# Patient Record
Sex: Female | Born: 1960
Health system: Southern US, Community
[De-identification: ages and names within clinical notes are randomized; demographics above are authoritative.]

## PROBLEM LIST (undated history)

## (undated) DIAGNOSIS — F0390 Unspecified dementia without behavioral disturbance: Secondary | ICD-10-CM

## (undated) DIAGNOSIS — I447 Left bundle-branch block, unspecified: Secondary | ICD-10-CM

## (undated) DIAGNOSIS — K219 Gastro-esophageal reflux disease without esophagitis: Secondary | ICD-10-CM

## (undated) DIAGNOSIS — G4700 Insomnia, unspecified: Secondary | ICD-10-CM

## (undated) DIAGNOSIS — I509 Heart failure, unspecified: Secondary | ICD-10-CM

## (undated) DIAGNOSIS — D509 Iron deficiency anemia, unspecified: Secondary | ICD-10-CM

## (undated) DIAGNOSIS — I10 Essential (primary) hypertension: Secondary | ICD-10-CM

## (undated) DIAGNOSIS — Z95810 Presence of automatic (implantable) cardiac defibrillator: Secondary | ICD-10-CM

## (undated) HISTORY — PX: OTHER SURGICAL HISTORY: SHX169

## (undated) HISTORY — PX: DILATION AND CURETTAGE OF UTERUS: SHX78

## (undated) HISTORY — PX: ESOPHAGOGASTRODUODENOSCOPY: SHX1529

## (undated) HISTORY — PX: CARDIAC CATHETERIZATION: SHX172

## (undated) HISTORY — PX: ANKLE SURGERY: SHX546

## (undated) HISTORY — PX: TUBAL LIGATION: SHX77

## (undated) HISTORY — PX: COLONOSCOPY: SHX174

---

## 2004-08-15 ENCOUNTER — Ambulatory Visit: Payer: Self-pay

## 2005-10-20 ENCOUNTER — Ambulatory Visit: Payer: Self-pay | Admitting: Internal Medicine

## 2006-03-25 ENCOUNTER — Emergency Department: Payer: Self-pay | Admitting: Emergency Medicine

## 2008-11-03 HISTORY — PX: OTHER SURGICAL HISTORY: SHX169

## 2011-03-26 ENCOUNTER — Ambulatory Visit: Payer: Self-pay | Admitting: Internal Medicine

## 2011-09-01 ENCOUNTER — Ambulatory Visit: Payer: Self-pay | Admitting: Internal Medicine

## 2011-10-16 ENCOUNTER — Ambulatory Visit: Payer: Self-pay | Admitting: Gastroenterology

## 2011-10-17 LAB — PATHOLOGY REPORT

## 2012-06-01 ENCOUNTER — Emergency Department: Payer: Self-pay | Admitting: Emergency Medicine

## 2012-06-01 LAB — TROPONIN I
Troponin-I: <0.02 ng/mL
Troponin-I: <0.02 ng/mL

## 2012-06-01 LAB — CBC WITH DIFFERENTIAL/PLATELET
Eosinophil %: 10.6 %
HCT: 32.6 % — ABNORMAL LOW (ref 35.0–47.0)
Lymphocyte #: 2.3 10*3/uL (ref 1.0–3.6)
Lymphocyte %: 30.2 %
MCH: 28.3 pg (ref 26.0–34.0)
MCHC: 33.5 g/dL (ref 32.0–36.0)
MCV: 84 fL (ref 80–100)
Neutrophil #: 4 10*3/uL (ref 1.4–6.5)
Platelet: 260 10*3/uL (ref 150–440)
RDW: 15.8 % — ABNORMAL HIGH (ref 11.5–14.5)
WBC: 7.7 10*3/uL (ref 3.6–11.0)

## 2012-06-01 LAB — COMPREHENSIVE METABOLIC PANEL
Albumin: 3.1 g/dL — ABNORMAL LOW (ref 3.4–5.0)
Anion Gap: 6 — ABNORMAL LOW (ref 7–16)
Calcium, Total: 8.2 mg/dL — ABNORMAL LOW (ref 8.5–10.1)
Chloride: 108 mmol/L — ABNORMAL HIGH (ref 98–107)
Co2: 25 mmol/L (ref 21–32)
EGFR (African American): >60
Glucose: 105 mg/dL — ABNORMAL HIGH (ref 65–99)
Osmolality: 279 (ref 275–301)
Potassium: 4.6 mmol/L (ref 3.5–5.1)
SGPT (ALT): 15 U/L
Sodium: 139 mmol/L (ref 136–145)
Total Protein: 6.6 g/dL (ref 6.4–8.2)

## 2012-06-01 LAB — URINALYSIS, COMPLETE
Bilirubin,UR: NEGATIVE
Blood: NEGATIVE
Glucose,UR: NEGATIVE mg/dL (ref 0–75)
Leukocyte Esterase: NEGATIVE
Nitrite: NEGATIVE
Ph: 5 (ref 4.5–8.0)
RBC,UR: <1 /HPF (ref 0–5)
Specific Gravity: 1.006 (ref 1.003–1.030)
Squamous Epithelial: <1

## 2012-06-04 ENCOUNTER — Ambulatory Visit: Payer: Self-pay | Admitting: Internal Medicine

## 2014-04-06 DIAGNOSIS — I429 Cardiomyopathy, unspecified: Secondary | ICD-10-CM

## 2014-09-07 DIAGNOSIS — I5022 Chronic systolic (congestive) heart failure: Secondary | ICD-10-CM | POA: Diagnosis present

## 2014-09-21 ENCOUNTER — Ambulatory Visit: Payer: Self-pay | Admitting: Cardiology

## 2014-09-21 LAB — BASIC METABOLIC PANEL
Anion Gap: 5 — ABNORMAL LOW (ref 7–16)
BUN: 19 mg/dL — ABNORMAL HIGH (ref 7–18)
CALCIUM: 9.5 mg/dL (ref 8.5–10.1)
CO2: 29 mmol/L (ref 21–32)
CREATININE: 0.89 mg/dL (ref 0.60–1.30)
Chloride: 105 mmol/L (ref 98–107)
EGFR (Non-African Amer.): >60
GLUCOSE: 81 mg/dL (ref 65–99)
OSMOLALITY: 279 (ref 275–301)
Potassium: 3.9 mmol/L (ref 3.5–5.1)
Sodium: 139 mmol/L (ref 136–145)

## 2014-09-21 LAB — CBC WITH DIFFERENTIAL/PLATELET
BASOS ABS: 0.1 10*3/uL (ref 0.0–0.1)
Basophil %: 0.4 %
EOS PCT: 2.9 %
Eosinophil #: 0.4 10*3/uL (ref 0.0–0.7)
HCT: 38.8 % (ref 35.0–47.0)
HGB: 12.2 g/dL (ref 12.0–16.0)
LYMPHS PCT: 11.9 %
Lymphocyte #: 1.5 10*3/uL (ref 1.0–3.6)
MCH: 27.1 pg (ref 26.0–34.0)
MCHC: 31.4 g/dL — AB (ref 32.0–36.0)
MCV: 86 fL (ref 80–100)
MONO ABS: 0.6 x10 3/mm (ref 0.2–0.9)
Monocyte %: 5 %
NEUTROS PCT: 79.8 %
Neutrophil #: 10.1 10*3/uL — ABNORMAL HIGH (ref 1.4–6.5)
Platelet: 342 10*3/uL (ref 150–440)
RBC: 4.51 10*6/uL (ref 3.80–5.20)
RDW: 16.5 % — AB (ref 11.5–14.5)
WBC: 12.7 10*3/uL — ABNORMAL HIGH (ref 3.6–11.0)

## 2014-09-21 LAB — URINALYSIS, COMPLETE
BACTERIA: NONE SEEN
BLOOD: NEGATIVE
Bilirubin,UR: NEGATIVE
GLUCOSE, UR: NEGATIVE mg/dL (ref 0–75)
KETONE: NEGATIVE
LEUKOCYTE ESTERASE: NEGATIVE
NITRITE: NEGATIVE
Ph: 5 (ref 4.5–8.0)
Protein: NEGATIVE
RBC,UR: <1 /HPF (ref 0–5)
Specific Gravity: 1.004 (ref 1.003–1.030)
Squamous Epithelial: <1

## 2014-09-21 LAB — PROTIME-INR
INR: 0.9
PROTHROMBIN TIME: 12 s (ref 11.5–14.7)

## 2014-09-21 LAB — APTT: Activated PTT: 26.7 secs (ref 23.6–35.9)

## 2014-10-06 ENCOUNTER — Ambulatory Visit: Payer: Self-pay | Admitting: Cardiology

## 2015-02-28 NOTE — Op Note (Signed)
PATIENT NAME:  Katrina Rose, Katrina Rose MR#:  [id number] DATE OF BIRTH:  [birth year]  DATE OF PROCEDURE:  10/06/2014  PROCEDURE: Implantation of a new biventricular ICD generator  INDICATION:  1.  Biventricular ICD generator at elective replacement indicator.  2.  Chronic systolic heart failure.   DETAILS OF PROCEDURE: Written informed consent was obtained. The risks and benefits of the procedure were explained to the patient and she consented to proceed. The patient was brought to the operating room in a fasting, nonsedated state. The appropriate resuscitative equipment was attached to the patient. The patient's initial biventricular implantable cardioverter defibrillator was placed April 23, 2009, and the indication was for primary prevention of sudden cardiac arrest and left bundle branch block and chronic systolic heart failure. She has had no therapies for this particular device.   The patient was prepped and draped in the usual sterile manner. The area of the left infraclavicular fossa was anesthetized with 1% lidocaine. Using a scalpel, a 3 cm incision was made over the old device site. Using a combination of electrocautery and blunt dissection, the old ICD generator was extracted from the pocket and the leads were dissected free. The ICD pocket was then revised to accommodate the new device. The leads were tested and showed the following values: Sensed P waves were 2.3 mV. Sensed R waves were 8.8 mV. The lead impedance on the atrial lead was 532 ohms, the lead impedance on the right ventricular pacing lead was 437 ohms, and the lead impedance on the LV pacing lead was 665 ohms. The right ventricular defibrillator coil was 46 ohms and the SVC defibrillator coil was 65 ohms for impedance values. Threshold testing was performed and was as follows: The threshold on the right atrial lead was 1 v at 0.4 ms and for LV tip to LV ring configuration the threshold on the LV lead was 2 v at 0.4 ms and testing of the RV  lead threshold was as follows: One volt at 0.4 ms. Given that the leads were in excellent functioning capacity, the leads were attached to the new ICD generator and that generator was placed in the pocket. The pocket was irrigated with copious amounts of gentamicin solution and adequate hemostasis was obtained. The pocket was then closed with running layers of 2-0 Vicryl and the 3-0 and subcuticular 4-0 layer were closed with V-Loc. Steri-Strips were applied over the incision site as well as a sterile gauze and OpSite. At the conclusion of the procedure, there were no complications noted. Estimated blood loss was less than 5 mL and the patient recovered appropriately with no issues from anesthesia.   SUMMARY OF IMPLANTED HARDWARE: The patient received a Medtronic biventricular ICD. The device was a Viva XT CRT-D device. Model number [id number]. The serial number was [id number]. That was implanted on October 06, 2014. The right atrial lead is a Medtronic [id number]. Serial number [id number]. It is a Medtronic lead. The implantation date was April 23, 2009. The RV lead is a Technical brewer, serial number [id number]. The model number is [id number]. It also was implanted April 23, 2009. The LV lead was a Medtronic 4196 Attain Ability. Serial number [id number]. Implanted April 23, 2009.   FINAL PROGRAM PARAMETERS: The device was programmed mode DDD with a lower rate limit of 50, upper tracking rate 150, upper sensor rate 150, adaptive pacing was turned on. Paced AV delay 130. Sensed AV delay 100. Outputs for the leads: Atrial lead 3.5 v at 0.4 ms, RV lead  3.5 v at 0.4 ms, and the LV lead 4 v at 0.4 ms with an amplitude margin of 0.5, RV of 2 and atrial of 2. Tachy therapies were programmed as follows: There is a VF zone set at 200 beats per minute with 30 out of 40 detection and defibrillator therapies programmed on, 35 joules maximum output shocks. ATP on during charging. VT zone set at to monitor at 171 beats per minute,  detection at 32, and no therapies programmed on.  ____________________________ Priscille Heidelberg. Marcello Moores, MD klt:sb D: 10/12/2014 12:42:00 ET T: 10/12/2014 13:40:05 ET JOB#: [id number]  cc: Lennette Bihari L. Marcello Moores, MD, <Dictator>  Marzetta Board MD ELECTRONICALLY SIGNED 11/08/2014 9:36

## 2016-07-20 ENCOUNTER — Encounter: Payer: Self-pay | Admitting: Emergency Medicine

## 2016-07-20 ENCOUNTER — Observation Stay
Admission: EM | Admit: 2016-07-20 | Discharge: 2016-07-22 | Disposition: A | Discharge status: Home / Self Care | Payer: Self-pay | Attending: Internal Medicine | Admitting: Internal Medicine

## 2016-07-20 DIAGNOSIS — I5022 Chronic systolic (congestive) heart failure: Secondary | ICD-10-CM | POA: Insufficient documentation

## 2016-07-20 DIAGNOSIS — E860 Dehydration: Secondary | ICD-10-CM | POA: Insufficient documentation

## 2016-07-20 DIAGNOSIS — A0472 Enterocolitis due to Clostridium difficile, not specified as recurrent: Secondary | ICD-10-CM

## 2016-07-20 DIAGNOSIS — Z882 Allergy status to sulfonamides status: Secondary | ICD-10-CM | POA: Insufficient documentation

## 2016-07-20 DIAGNOSIS — Z800 Family history of malignant neoplasm of digestive organs: Secondary | ICD-10-CM | POA: Insufficient documentation

## 2016-07-20 DIAGNOSIS — Z9851 Tubal ligation status: Secondary | ICD-10-CM | POA: Insufficient documentation

## 2016-07-20 DIAGNOSIS — Z79899 Other long term (current) drug therapy: Secondary | ICD-10-CM | POA: Insufficient documentation

## 2016-07-20 DIAGNOSIS — Z87891 Personal history of nicotine dependence: Secondary | ICD-10-CM | POA: Insufficient documentation

## 2016-07-20 DIAGNOSIS — Z23 Encounter for immunization: Secondary | ICD-10-CM | POA: Insufficient documentation

## 2016-07-20 DIAGNOSIS — Z95810 Presence of automatic (implantable) cardiac defibrillator: Secondary | ICD-10-CM | POA: Insufficient documentation

## 2016-07-20 DIAGNOSIS — A047 Enterocolitis due to Clostridium difficile: Principal | ICD-10-CM | POA: Insufficient documentation

## 2016-07-20 DIAGNOSIS — I110 Hypertensive heart disease with heart failure: Secondary | ICD-10-CM | POA: Insufficient documentation

## 2016-07-20 DIAGNOSIS — Z8249 Family history of ischemic heart disease and other diseases of the circulatory system: Secondary | ICD-10-CM | POA: Insufficient documentation

## 2016-07-20 DIAGNOSIS — Z7982 Long term (current) use of aspirin: Secondary | ICD-10-CM | POA: Insufficient documentation

## 2016-07-20 HISTORY — DX: Presence of automatic (implantable) cardiac defibrillator: Z95.810

## 2016-07-20 HISTORY — DX: Heart failure, unspecified: I50.9

## 2016-07-20 HISTORY — DX: Insomnia, unspecified: G47.00

## 2016-07-20 HISTORY — DX: Essential (primary) hypertension: I10

## 2016-07-20 LAB — C DIFFICILE QUICK SCREEN W PCR REFLEX
C DIFFICLE (CDIFF) ANTIGEN: POSITIVE — AB
C Diff toxin: NEGATIVE

## 2016-07-20 LAB — URINALYSIS COMPLETE WITH MICROSCOPIC (ARMC ONLY)
BILIRUBIN URINE: NEGATIVE
Bacteria, UA: NONE SEEN
GLUCOSE, UA: NEGATIVE mg/dL
Hgb urine dipstick: NEGATIVE
Ketones, ur: NEGATIVE mg/dL
Leukocytes, UA: NEGATIVE
Nitrite: NEGATIVE
PH: 5 (ref 5.0–8.0)
PROTEIN: NEGATIVE mg/dL
RBC / HPF: NONE SEEN RBC/hpf (ref 0–5)
SQUAMOUS EPITHELIAL / LPF: NONE SEEN
Specific Gravity, Urine: 1.006 (ref 1.005–1.030)
WBC, UA: NONE SEEN WBC/hpf (ref 0–5)

## 2016-07-20 LAB — GASTROINTESTINAL PANEL BY PCR, STOOL (REPLACES STOOL CULTURE)
ADENOVIRUS F40/41: NOT DETECTED
Astrovirus: NOT DETECTED
CRYPTOSPORIDIUM: NOT DETECTED
CYCLOSPORA CAYETANENSIS: NOT DETECTED
Campylobacter species: NOT DETECTED
E. coli O157: NOT DETECTED
ENTEROPATHOGENIC E COLI (EPEC): NOT DETECTED
Entamoeba histolytica: NOT DETECTED
Enteroaggregative E coli (EAEC): NOT DETECTED
Enterotoxigenic E coli (ETEC): NOT DETECTED
GIARDIA LAMBLIA: NOT DETECTED
Norovirus GI/GII: NOT DETECTED
Plesimonas shigelloides: NOT DETECTED
ROTAVIRUS A: NOT DETECTED
SHIGA LIKE TOXIN PRODUCING E COLI (STEC): NOT DETECTED
Salmonella species: NOT DETECTED
Sapovirus (I, II, IV, and V): NOT DETECTED
Shigella/Enteroinvasive E coli (EIEC): NOT DETECTED
VIBRIO SPECIES: NOT DETECTED
Vibrio cholerae: NOT DETECTED
YERSINIA ENTEROCOLITICA: NOT DETECTED

## 2016-07-20 LAB — LIPASE, BLOOD: LIPASE: 48 U/L (ref 11–51)

## 2016-07-20 LAB — COMPREHENSIVE METABOLIC PANEL
ALK PHOS: 83 U/L (ref 38–126)
ALT: 13 U/L — AB (ref 14–54)
AST: 18 U/L (ref 15–41)
Albumin: 4.2 g/dL (ref 3.5–5.0)
Anion gap: 7 (ref 5–15)
BUN: 28 mg/dL — AB (ref 6–20)
CALCIUM: 9.3 mg/dL (ref 8.9–10.3)
CHLORIDE: 106 mmol/L (ref 101–111)
CO2: 20 mmol/L — AB (ref 22–32)
CREATININE: 1.36 mg/dL — AB (ref 0.44–1.00)
GFR, EST AFRICAN AMERICAN: 50 mL/min — AB (ref >60)
GFR, EST NON AFRICAN AMERICAN: 43 mL/min — AB (ref >60)
Glucose, Bld: 105 mg/dL — ABNORMAL HIGH (ref 65–99)
Potassium: 4.1 mmol/L (ref 3.5–5.1)
SODIUM: 133 mmol/L — AB (ref 135–145)
Total Bilirubin: 0.3 mg/dL (ref 0.3–1.2)
Total Protein: 7.9 g/dL (ref 6.5–8.1)

## 2016-07-20 LAB — CLOSTRIDIUM DIFFICILE BY PCR: Toxigenic C. Difficile by PCR: POSITIVE — AB

## 2016-07-20 LAB — CBC
HCT: 32.4 % — ABNORMAL LOW (ref 35.0–47.0)
Hemoglobin: 11.1 g/dL — ABNORMAL LOW (ref 12.0–16.0)
MCH: 27.4 pg (ref 26.0–34.0)
MCHC: 34.1 g/dL (ref 32.0–36.0)
MCV: 80.4 fL (ref 80.0–100.0)
PLATELETS: 424 10*3/uL (ref 150–440)
RBC: 4.04 MIL/uL (ref 3.80–5.20)
RDW: 15.9 % — AB (ref 11.5–14.5)
WBC: 6.6 10*3/uL (ref 3.6–11.0)

## 2016-07-20 MED ORDER — INFLUENZA VAC SPLIT QUAD 0.5 ML IM SUSY
0.5000 mL | PREFILLED_SYRINGE | INTRAMUSCULAR | Status: AC
  Administered 2016-07-21: 0.5 mL via INTRAMUSCULAR
  Filled 2016-07-20: qty 0.5

## 2016-07-20 MED ORDER — SODIUM CHLORIDE 0.9 % IV SOLN
Freq: Once | INTRAVENOUS | Status: AC
  Administered 2016-07-20: 18:00:00 via INTRAVENOUS

## 2016-07-20 MED ORDER — SODIUM CHLORIDE 0.9 % IV SOLN
Freq: Once | INTRAVENOUS | Status: AC
  Administered 2016-07-20: 14:00:00 via INTRAVENOUS

## 2016-07-20 MED ORDER — METRONIDAZOLE 500 MG PO TABS
500.0000 mg | ORAL_TABLET | Freq: Once | ORAL | Status: AC
  Administered 2016-07-20: 500 mg via ORAL
  Filled 2016-07-20: qty 1

## 2016-07-20 MED ORDER — ASPIRIN EC 81 MG PO TBEC
81.0000 mg | DELAYED_RELEASE_TABLET | Freq: Every day | ORAL | Status: DC
  Administered 2016-07-21 – 2016-07-22 (×2): 81 mg via ORAL
  Filled 2016-07-20 (×3): qty 1

## 2016-07-20 MED ORDER — BUPROPION HCL ER (XL) 300 MG PO TB24
300.0000 mg | ORAL_TABLET | Freq: Every day | ORAL | Status: DC
  Filled 2016-07-20: qty 1

## 2016-07-20 MED ORDER — TRAZODONE HCL 100 MG PO TABS
100.0000 mg | ORAL_TABLET | Freq: Every day | ORAL | Status: DC
  Administered 2016-07-21: 100 mg via ORAL
  Filled 2016-07-20: qty 1

## 2016-07-20 MED ORDER — METRONIDAZOLE 500 MG PO TABS: 500.0000 mg | ORAL_TABLET | Freq: Three times a day (TID) | ORAL | 0 refills | Status: DC

## 2016-07-20 MED ORDER — METRONIDAZOLE IN NACL 5-0.79 MG/ML-% IV SOLN
500.0000 mg | Freq: Three times a day (TID) | INTRAVENOUS | Status: DC
  Administered 2016-07-21 (×2): 500 mg via INTRAVENOUS
  Filled 2016-07-20 (×4): qty 100

## 2016-07-20 MED ORDER — SODIUM CHLORIDE 0.9 % IV SOLN
Freq: Once | INTRAVENOUS | Status: AC
  Administered 2016-07-20: 17:00:00 via INTRAVENOUS

## 2016-07-20 MED ORDER — METRONIDAZOLE IN NACL 5-0.79 MG/ML-% IV SOLN
500.0000 mg | Freq: Three times a day (TID) | INTRAVENOUS | Status: DC
  Filled 2016-07-20 (×3): qty 100

## 2016-07-20 MED ORDER — LOPERAMIDE HCL 2 MG PO CAPS
4.0000 mg | ORAL_CAPSULE | Freq: Once | ORAL | Status: AC
  Administered 2016-07-20: 4 mg via ORAL
  Filled 2016-07-20: qty 2

## 2016-07-20 MED ORDER — SODIUM CHLORIDE 0.9 % IV SOLN
INTRAVENOUS | Status: DC
  Administered 2016-07-20 – 2016-07-21 (×2): via INTRAVENOUS

## 2016-07-20 MED ORDER — ONDANSETRON HCL 4 MG/2ML IJ SOLN
4.0000 mg | Freq: Once | INTRAMUSCULAR | Status: AC
  Administered 2016-07-20: 4 mg via INTRAVENOUS
  Filled 2016-07-20: qty 2

## 2016-07-20 MED ORDER — PANTOPRAZOLE SODIUM 40 MG PO TBEC
40.0000 mg | DELAYED_RELEASE_TABLET | Freq: Every day | ORAL | Status: DC
  Filled 2016-07-20: qty 1

## 2016-07-20 MED ORDER — HEPARIN SODIUM (PORCINE) 5000 UNIT/ML IJ SOLN
5000.0000 [IU] | Freq: Three times a day (TID) | INTRAMUSCULAR | Status: DC
  Administered 2016-07-20 – 2016-07-22 (×5): 5000 [IU] via SUBCUTANEOUS
  Filled 2016-07-20 (×5): qty 1

## 2016-07-20 NOTE — ED Triage Notes (Addendum)
Pt reports multiple episodes of diarrhea for past 5 month but has recently gotten worse over the past month. Pt. States she has been having to wear a diaper at night. Denies any pain at this time. Reports feeling light headed and dizzy.

## 2016-07-20 NOTE — H&P (Signed)
[place] at [place] NAME: Katrina Rose    MR#:  [id number]  DATE OF BIRTH:  09-22-1961  DATE OF ADMISSION:  07/20/2016  PRIMARY CARE PHYSICIAN: No primary care provider on file.   REQUESTING/REFERRING PHYSICIAN: williams  CHIEF COMPLAINT:   Chief Complaint  Patient presents with  . Diarrhea    HISTORY OF PRESENT ILLNESS: Katrina Rose  is a 55 y.o. female with a known history of Htn, CHF s/p AICD- had tooth procedure in April- and given some Abx, since then having diarrhea 5-6 days a week, watery- multiple times a day, sometimes every 20 min. Have to wear a diper at night. As she does not have insurance, she did not see any doctor for this. Also feeling dizzi sometimes while standing. In ER noted to have C diff and BP was in 80 systolic.  PAST MEDICAL HISTORY:   Past Medical History:  Diagnosis Date  . AICD (automatic cardioverter/defibrillator) present   . CHF (congestive heart failure) ([place])   . Hypertension   . Insomnia     PAST SURGICAL HISTORY:  Past Surgical History:  Procedure Laterality Date  . ANKLE SURGERY Left   . DILATION AND CURETTAGE OF UTERUS    . pacermaker/ defibrilater   2010  . TUBAL LIGATION      SOCIAL HISTORY:  Social History  Substance Use Topics  . Smoking status: Former Research scientist (life sciences)  . Smokeless tobacco: Never Used  . Alcohol use Yes     Comment: ocas.    FAMILY HISTORY:  Family History  Problem Relation Age of Onset  . CAD Mother   . Colon cancer Father     DRUG ALLERGIES:  Allergies  Allergen Reactions  . Sulfa Antibiotics Hives    REVIEW OF SYSTEMS:   CONSTITUTIONAL: No fever, fatigue or weakness.  EYES: No blurred or double vision.  EARS, NOSE, AND THROAT: No tinnitus or ear pain.  RESPIRATORY: No cough, shortness of breath, wheezing or hemoptysis.  CARDIOVASCULAR: No chest pain, orthopnea, edema.  GASTROINTESTINAL: No nausea, vomiting, positive for diarrhea, no abdominal pain.   GENITOURINARY: No dysuria, hematuria.  ENDOCRINE: No polyuria, nocturia,  HEMATOLOGY: No anemia, easy bruising or bleeding SKIN: No rash or lesion. MUSCULOSKELETAL: No joint pain or arthritis.   NEUROLOGIC: No tingling, numbness, weakness.  PSYCHIATRY: No anxiety or depression.   MEDICATIONS AT HOME:  Prior to Admission medications   Medication Sig Start Date End Date Taking? Authorizing Provider  aspirin EC 81 MG tablet Take 1 tablet by mouth daily.   Yes Historical Provider, MD  buPROPion (WELLBUTRIN XL) 300 MG 24 hr tablet Take 1 tablet by mouth daily. 10/12/15  Yes Historical Provider, MD  carvedilol (COREG) 12.5 MG tablet Take 1 tablet by mouth 2 (two) times daily. 12/06/15  Yes Historical Provider, MD  furosemide (LASIX) 20 MG tablet Take 1 tablet by mouth daily. 10/12/15  Yes Historical Provider, MD  lisinopril (PRINIVIL,ZESTRIL) 5 MG tablet Take 0.5 tablets by mouth 2 (two) times daily. 03/11/16  Yes Historical Provider, MD  omeprazole (PRILOSEC) 20 MG capsule Take 1 capsule by mouth daily. 03/11/16 03/11/17 Yes Historical Provider, MD  spironolactone (ALDACTONE) 25 MG tablet Take 1 tablet by mouth daily. 07/10/15  Yes Historical Provider, MD  metroNIDAZOLE (FLAGYL) 500 MG tablet Take 1 tablet (500 mg total) by mouth 3 (three) times daily. 07/20/16   Earleen Newport, MD      PHYSICAL EXAMINATION:   VITAL SIGNS: Blood pressure 97/83,  pulse 96, temperature 98 F (36.7 C), temperature source Oral, resp. rate 18, height 5' (1.524 m), weight 50.8 kg (112 lb), SpO2 100 %.  GENERAL:  55 y.o.-year-old patient lying in the bed with no acute distress.  EYES: Pupils equal, round, reactive to light and accommodation. No scleral icterus. Extraocular muscles intact.  HEENT: Head atraumatic, normocephalic. Oropharynx and nasopharynx clear.  NECK:  Supple, no jugular venous distention. No thyroid enlargement, no tenderness.  LUNGS: Normal breath sounds bilaterally, no wheezing, rales,rhonchi or  crepitation. No use of accessory muscles of respiration.  CARDIOVASCULAR: S1, S2 normal. No murmurs, rubs, or gallops.  ABDOMEN: Soft, nontender, nondistended. Bowel sounds present. No organomegaly or mass.  EXTREMITIES: No pedal edema, cyanosis, or clubbing.  NEUROLOGIC: Cranial nerves II through XII are intact. Muscle strength 5/5 in all extremities. Sensation intact. Gait not checked.  PSYCHIATRIC: The patient is alert and oriented x 3.  SKIN: No obvious rash, lesion, or ulcer.   LABORATORY PANEL:   CBC  Recent Labs Lab 07/20/16 1215  WBC 6.6  HGB 11.1*  HCT 32.4*  PLT 424  MCV 80.4  MCH 27.4  MCHC 34.1  RDW 15.9*   ------------------------------------------------------------------------------------------------------------------  Chemistries   Recent Labs Lab 07/20/16 1215  NA 133*  K 4.1  CL 106  CO2 20*  GLUCOSE 105*  BUN 28*  CREATININE 1.36*  CALCIUM 9.3  AST 18  ALT 13*  ALKPHOS 83  BILITOT 0.3   ------------------------------------------------------------------------------------------------------------------ estimated creatinine clearance is 33.6 mL/min (by C-G formula based on SCr of 1.36 mg/dL (H)). ------------------------------------------------------------------------------------------------------------------ No results for input(s): TSH, T4TOTAL, T3FREE, THYROIDAB in the last 72 hours.  Invalid input(s): FREET3   Coagulation profile No results for input(s): INR, PROTIME in the last 168 hours. ------------------------------------------------------------------------------------------------------------------- No results for input(s): DDIMER in the last 72 hours. -------------------------------------------------------------------------------------------------------------------  Cardiac Enzymes No results for input(s): CKMB, TROPONINI, MYOGLOBIN in the last 168 hours.  Invalid input(s):  CK ------------------------------------------------------------------------------------------------------------------ Invalid input(s): POCBNP  ---------------------------------------------------------------------------------------------------------------  Urinalysis    Component Value Date/Time   COLORURINE STRAW (A) 07/20/2016 1525   APPEARANCEUR CLEAR (A) 07/20/2016 1525   APPEARANCEUR Clear 09/21/2014 1003   LABSPEC 1.006 07/20/2016 1525   LABSPEC 1.004 09/21/2014 1003   PHURINE 5.0 07/20/2016 1525   GLUCOSEU NEGATIVE 07/20/2016 1525   GLUCOSEU Negative 09/21/2014 1003   HGBUR NEGATIVE 07/20/2016 1525   BILIRUBINUR NEGATIVE 07/20/2016 1525   BILIRUBINUR Negative 09/21/2014 1003   KETONESUR NEGATIVE 07/20/2016 1525   PROTEINUR NEGATIVE 07/20/2016 1525   NITRITE NEGATIVE 07/20/2016 1525   LEUKOCYTESUR NEGATIVE 07/20/2016 1525   LEUKOCYTESUR Negative 09/21/2014 1003     RADIOLOGY: No results found.  EKG: Orders placed or performed during the hospital encounter of 07/20/16  . ED EKG  . ED EKG  . EKG 12-Lead  . EKG 12-Lead    IMPRESSION AND PLAN:  * C diff diarrhea   IV flagyl.   Councelled about spread by Touch and precaution.  * Hypertension   Hold meds as hypotensive on presentation   Check Orthostatic vitals.  * Ch systolic CHF- s/p AICD   Stable, Hold lasix for now, monitor.   All the records are reviewed and case discussed with ED provider. Management plans discussed with the patient, family and they are in agreement.  CODE STATUS: Full. Code Status History    This patient does not have a recorded code status. Please follow your organizational policy for patients in this situation.     Explained in presence of her sister  and daughter.  TOTAL TIME TAKING CARE OF THIS PATIENT: 50 minutes.    Vaughan Basta M.D on 07/20/2016   Between 7am to 6pm - Pager - [phone]  After 6pm go to [email or web address] - password [email or web address]  [facility]  Office  [phone]  CC: Primary care physician; No primary care provider on file.   Note: This dictation was prepared with Dragon dictation along with smaller phrase technology. Any transcriptional errors that result from this process are unintentional.

## 2016-07-20 NOTE — ED Provider Notes (Addendum)
[facility] Emergency Department Provider Note        Time seen: ----------------------------------------- 2:13 PM on 07/20/2016 -----------------------------------------    I have reviewed the triage vital signs and the nursing notes.   HISTORY  Chief Complaint Diarrhea    HPI Katrina Rose is a 55 y.o. female who presents to ER for multiple episodes of diarrhea for the past 5 months. Patient states recently gotten worse over the past 30 days. Patient states she's having to wear a diaper at night, denies any pain but has had some nausea. She reports feeling lightheaded and dizzy. She denies seeing a gastroenterologist in the past, nothing has made her symptoms better or worse. Patient denies fevers, chills or other complaints.   Past Medical History:  Diagnosis Date  . Hypertension   . Insomnia     There are no active problems to display for this patient.   Past Surgical History:  Procedure Laterality Date  . ANKLE SURGERY Left   . DILATION AND CURETTAGE OF UTERUS    . pacermaker/ defibrilater   2010  . TUBAL LIGATION      Allergies Sulfa antibiotics  Social History Social History  Substance Use Topics  . Smoking status: Former Research scientist (life sciences)  . Smokeless tobacco: Never Used  . Alcohol use Yes     Comment: ocas.    Review of Systems Constitutional: Negative for fever. Cardiovascular: Negative for chest pain. Respiratory: Negative for shortness of breath. Gastrointestinal: Negative for abdominal pain, Positive for diarrhea Genitourinary: Negative for dysuria. Musculoskeletal: Negative for back pain. Skin: Negative for rash. Neurological: Negative for headaches, positive for weakness  10-point ROS otherwise negative.  ____________________________________________   PHYSICAL EXAM:  VITAL SIGNS: ED Triage Vitals  Enc Vitals Group     BP 07/20/16 1155 97/79     Pulse Rate 07/20/16 1155 (!) 113     Resp 07/20/16 1155 18     Temp  07/20/16 1155 98 F (36.7 C)     Temp Source 07/20/16 1155 Oral     SpO2 07/20/16 1155 100 %     Weight 07/20/16 1159 112 lb (50.8 kg)     Height 07/20/16 1159 5' (1.524 m)     Head Circumference --      Peak Flow --      Pain Score --      Pain Loc --      Pain Edu? --      Excl. in [place]? --    Constitutional: Alert and oriented. Anxious, no distress Eyes: Conjunctivae are normal. PERRL. Normal extraocular movements. ENT   Head: Normocephalic and atraumatic.   Nose: No congestion/rhinnorhea.   Mouth/Throat: Mucous membranes are moist.   Neck: No stridor. Cardiovascular: Normal rate, regular rhythm. No murmurs, rubs, or gallops. Respiratory: Normal respiratory effort without tachypnea nor retractions. Breath sounds are clear and equal bilaterally. No wheezes/rales/rhonchi. Gastrointestinal: Soft and nontender. Hyperactive bowel sounds Musculoskeletal: Nontender with normal range of motion in all extremities. No lower extremity tenderness nor edema. Neurologic:  Normal speech and language. No gross focal neurologic deficits are appreciated.  Skin:  Skin is warm, dry and intact. No rash noted. Psychiatric: Mood and affect are normal. Speech and behavior are normal.  ____________________________________________  EKG: Interpreted by me. Atrial sensing ventricular paced rhythm with a rate of 105 bpm.  ____________________________________________  ED COURSE:  Pertinent labs & imaging results that were available during my care of the patient were reviewed by me and considered in my  medical decision making (see chart for details). Clinical Course  Patient is in no distress, presents with 5 months of diarrhea. We will assess with basic labs, give IV fluid and send stool studies.  Procedures ____________________________________________   LABS (pertinent positives/negatives)  Labs Reviewed  C DIFFICILE QUICK SCREEN W PCR REFLEX - Abnormal; Notable for the following:        Result Value   C Diff antigen POSITIVE (*)    All other components within normal limits  CLOSTRIDIUM DIFFICILE BY PCR - Abnormal; Notable for the following:    Toxigenic C Difficile by pcr POSITIVE (*)    All other components within normal limits  COMPREHENSIVE METABOLIC PANEL - Abnormal; Notable for the following:    Sodium 133 (*)    CO2 20 (*)    Glucose, Bld 105 (*)    BUN 28 (*)    Creatinine, Ser 1.36 (*)    ALT 13 (*)    GFR calc non Af Amer 43 (*)    GFR calc Af Amer 50 (*)    All other components within normal limits  CBC - Abnormal; Notable for the following:    Hemoglobin 11.1 (*)    HCT 32.4 (*)    RDW 15.9 (*)    All other components within normal limits  URINALYSIS COMPLETEWITH MICROSCOPIC ([facility] ONLY) - Abnormal; Notable for the following:    Color, Urine STRAW (*)    APPearance CLEAR (*)    All other components within normal limits  GASTROINTESTINAL PANEL BY PCR, STOOL (REPLACES STOOL CULTURE)  LIPASE, BLOOD  ____________________________________________  FINAL ASSESSMENT AND PLAN  Diarrhea, Acute community-acquired C. difficile infection  Plan: Patient with labs and imaging as dictated above. Patient is in no acute distress, appears relatively symptomatic though. She has had borderline blood pressures while in the ER, we have given her IV fluid for this. She's been started on Flagyl because she is symptomatic with profuse diarrhea. I will discuss with the hospitalist for observation and she is not feeling well enough to go home. She'll be placed on enteric precautions.   Earleen Newport, MD   Note: This dictation was prepared with Dragon dictation. Any transcriptional errors that result from this process are unintentional    Earleen Newport, MD 07/20/16 1721    Earleen Newport, MD 07/20/16 [phone]

## 2016-07-21 ENCOUNTER — Encounter: Payer: Self-pay | Admitting: *Deleted

## 2016-07-21 LAB — BASIC METABOLIC PANEL
Anion gap: 7 (ref 5–15)
BUN: 15 mg/dL (ref 6–20)
CALCIUM: 8.1 mg/dL — AB (ref 8.9–10.3)
CO2: 18 mmol/L — ABNORMAL LOW (ref 22–32)
CREATININE: 0.83 mg/dL (ref 0.44–1.00)
Chloride: 115 mmol/L — ABNORMAL HIGH (ref 101–111)
GFR calc Af Amer: >60 mL/min (ref >60)
GLUCOSE: 81 mg/dL (ref 65–99)
Potassium: 3.9 mmol/L (ref 3.5–5.1)
SODIUM: 140 mmol/L (ref 135–145)

## 2016-07-21 LAB — CBC
HEMATOCRIT: 26 % — AB (ref 35.0–47.0)
Hemoglobin: 8.9 g/dL — ABNORMAL LOW (ref 12.0–16.0)
MCH: 27.5 pg (ref 26.0–34.0)
MCHC: 34 g/dL (ref 32.0–36.0)
MCV: 80.9 fL (ref 80.0–100.0)
PLATELETS: 342 10*3/uL (ref 150–440)
RBC: 3.22 MIL/uL — ABNORMAL LOW (ref 3.80–5.20)
RDW: 16.1 % — AB (ref 11.5–14.5)
WBC: 5.5 10*3/uL (ref 3.6–11.0)

## 2016-07-21 MED ORDER — METRONIDAZOLE 500 MG PO TABS
500.0000 mg | ORAL_TABLET | Freq: Three times a day (TID) | ORAL | Status: DC
  Administered 2016-07-21 – 2016-07-22 (×3): 500 mg via ORAL
  Filled 2016-07-21 (×4): qty 1

## 2016-07-21 NOTE — Progress Notes (Signed)
CONCERNING: Antibiotic IV to Oral Route Change Policy  RECOMMENDATION: This patient is receiving metronidazole by the intravenous route.  Based on criteria approved by the Pharmacy and Therapeutics Committee, the antibiotic(s) is/are being converted to the equivalent oral dose form(s).   DESCRIPTION: These criteria include:  Patient being treated for a respiratory tract infection, urinary tract infection, cellulitis or clostridium difficile associated diarrhea if on metronidazole  The patient is not neutropenic and does not exhibit a GI malabsorption state  The patient is eating (either orally or via tube) and/or has been taking other orally administered medications for a least 24 hours  The patient is improving clinically and has a Tmax < 100.5  If you have questions about this conversion, please contact the Pharmacy Department  []   [phone] )  [facility] [x]   [phone] )  [facility] [address] []   [phone] )  [facility] []   [phone] )  [facility] []   [phone] )  [facility]

## 2016-07-21 NOTE — Progress Notes (Signed)
[place] at [place] NAME: Katrina Rose    MR#:  [id number]  DATE OF BIRTH:  09-30-1961  SUBJECTIVE: 55 year old female patient admitted for diarrhea and found to have C. difficile colitis. Patient has been having diarrhea since April of this year. Started on IV Flagyl. Still having a lot of diarrhea. No abdominal pain or nausea. Tolerating the diet.   CHIEF COMPLAINT:   Chief Complaint  Patient presents with  . Diarrhea    REVIEW OF SYSTEMS:   ROS CONSTITUTIONAL: No fever, fatigue or weakness.  EYES: No blurred or double vision.  EARS, NOSE, AND THROAT: No tinnitus or ear pain.  RESPIRATORY: No cough, shortness of breath, wheezing or hemoptysis.  CARDIOVASCULAR: No chest pain, orthopnea, edema.  GASTROINTESTINAL: No nausea, vomiting, Diarrhea multiple times since last night.  GENITOURINARY: No dysuria, hematuria.  ENDOCRINE: No polyuria, nocturia,  HEMATOLOGY: No anemia, easy bruising or bleeding SKIN: No rash or lesion. MUSCULOSKELETAL: No joint pain or arthritis.   NEUROLOGIC: No tingling, numbness, weakness.  PSYCHIATRY: No anxiety or depression.   DRUG ALLERGIES:   Allergies  Allergen Reactions  . Sulfa Antibiotics Hives    VITALS:  Blood pressure (!) 101/56, pulse 83, temperature 98.3 F (36.8 C), temperature source Oral, resp. rate 20, height 5' (1.524 m), weight 50.8 kg (112 lb), SpO2 98 %.  PHYSICAL EXAMINATION:  GENERAL:  55 y.o.-year-old patient lying in the bed with no acute distress.  EYES: Pupils equal, round, reactive to light and accommodation. No scleral icterus. Extraocular muscles intact.  HEENT: Head atraumatic, normocephalic. Oropharynx and nasopharynx clear.  NECK:  Supple, no jugular venous distention. No thyroid enlargement, no tenderness.  LUNGS: Normal breath sounds bilaterally, no wheezing, rales,rhonchi or crepitation. No use of accessory muscles of respiration.  CARDIOVASCULAR: S1, S2  normal. No murmurs, rubs, or gallops.  ABDOMEN: Soft, nontender, nondistended. Bowel sounds present. No organomegaly or mass.  EXTREMITIES: No pedal edema, cyanosis, or clubbing.  NEUROLOGIC: Cranial nerves II through XII are intact. Muscle strength 5/5 in all extremities. Sensation intact. Gait not checked.  PSYCHIATRIC: The patient is alert and oriented x 3.  SKIN: No obvious rash, lesion, or ulcer.    LABORATORY PANEL:   CBC  Recent Labs Lab 07/21/16 0612  WBC 5.5  HGB 8.9*  HCT 26.0*  PLT 342   ------------------------------------------------------------------------------------------------------------------  Chemistries   Recent Labs Lab 07/20/16 1215 07/21/16 0612  NA 133* 140  K 4.1 3.9  CL 106 115*  CO2 20* 18*  GLUCOSE 105* 81  BUN 28* 15  CREATININE 1.36* 0.83  CALCIUM 9.3 8.1*  AST 18  --   ALT 13*  --   ALKPHOS 83  --   BILITOT 0.3  --    ------------------------------------------------------------------------------------------------------------------  Cardiac Enzymes No results for input(s): TROPONINI in the last 168 hours. ------------------------------------------------------------------------------------------------------------------  RADIOLOGY:  No results found.  EKG:   Orders placed or performed during the hospital encounter of 07/20/16  . ED EKG  . ED EKG  . EKG 12-Lead  . EKG 12-Lead    ASSESSMENT AND PLAN:   1.C. difficile colitis: Continue IV Flagyl, continue isolation, monitor closely add probiotics hypotension'; improved.; Continue to hold the metoprolol, Lasix secondary to continued diarrhea, borderline blood pressure. : #3 chronic systolic heart failure, status post AICD: Stable. Hold the Lasix because of dehydration, diarrhea. Watch closely. Discussed with register nurse, discussed with the patient.    All the records are reviewed and case discussed  with Care Management/Social Workerr. Management plans discussed with the  patient, family and they are in agreement.  CODE STATUS: full  TOTAL TIME TAKING CARE OF THIS PATIENT: 35 minutes.   POSSIBLE D/C IN 1-2 DAYS, DEPENDING ON CLINICAL CONDITION.   Epifanio Lesches M.D on 07/21/2016 at 1:38 PM  Between 7am to 6pm - Pager - [phone]  After 6pm go to [email or web address] - password [email or web address] [facility]  [place] [facility]  Office  [phone]  CC: Primary care physician; No primary care provider on file.   Note: This dictation was prepared with Dragon dictation along with smaller phrase technology. Any transcriptional errors that result from this process are unintentional.

## 2016-07-22 ENCOUNTER — Encounter: Payer: Self-pay | Admitting: *Deleted

## 2016-07-22 LAB — CBC
HEMATOCRIT: 26.1 % — AB (ref 35.0–47.0)
Hemoglobin: 8.6 g/dL — ABNORMAL LOW (ref 12.0–16.0)
MCH: 27 pg (ref 26.0–34.0)
MCHC: 32.8 g/dL (ref 32.0–36.0)
MCV: 82.2 fL (ref 80.0–100.0)
Platelets: 308 10*3/uL (ref 150–440)
RBC: 3.18 MIL/uL — ABNORMAL LOW (ref 3.80–5.20)
RDW: 16.1 % — AB (ref 11.5–14.5)
WBC: 4.2 10*3/uL (ref 3.6–11.0)

## 2016-07-22 MED ORDER — LISINOPRIL 2.5 MG PO TABS: 2.5000 mg | ORAL_TABLET | Freq: Every day | ORAL | 0 refills | Status: DC

## 2016-07-22 MED ORDER — CARVEDILOL 3.125 MG PO TABS: 3.1250 mg | ORAL_TABLET | Freq: Two times a day (BID) | ORAL | 0 refills | Status: DC

## 2016-07-22 MED ORDER — METRONIDAZOLE 500 MG PO TABS: 500.0000 mg | ORAL_TABLET | Freq: Three times a day (TID) | ORAL | 0 refills | Status: DC

## 2016-07-22 MED ORDER — LACTINEX PO CHEW: 1.0000 | CHEWABLE_TABLET | Freq: Three times a day (TID) | ORAL | 0 refills | Status: DC

## 2016-07-22 NOTE — Progress Notes (Signed)
Alert and oriented. Vital signs stable . No signs of acute distress. Discharge instructions given. Patient verbalized understanding. No other issues noted at this time. Refused wheelchair upon discharge.

## 2016-07-22 NOTE — Progress Notes (Signed)
Initial Nutrition Assessment  DOCUMENTATION CODES:   Not applicable  INTERVENTION:  -Provided pt with 2 chocolate milks, she is discharging today  NUTRITION DIAGNOSIS:   Inadequate oral intake related to poor appetite as evidenced by per patient/family report.  GOAL:   Patient will meet greater than or equal to 90% of their needs  MONITOR:   PO intake, I & O's, Labs, Weight trends  REASON FOR ASSESSMENT:   Malnutrition Screening Tool    ASSESSMENT:    Katrina Rose  is a 55 y.o. female with a known history of Htn, CHF s/p AICD- had tooth procedure in April- and given some Abx, since then having diarrhea 5-6 days a week, watery- multiple times a day, sometimes every 20 min. Have to wear a diper at night  Spoke with Katrina Rose at bedside. She endorses good appetite, but she was limiting her portion sizes because of diarrhea. Pt has been having diarrhea since April, states she had no insurance and did not want to return to the Doctor. Pt was wearing a diaper during this time, having up to 20 bms a day. She is no longer having diarrhea, PO intake is good, consumed 100% of breakfast this morning. She states her weight was 134# prior to the onset of this, currently she exhibits a 22#/16% severe wt loss over 6 months. She does not exhibit any signs of muscle wasting or fat depletions. She is discharging this afternoon, pt requested Chocolate milk on tray for lunch but was told she could not have it due to 2g NA diet. Provided pt with chocolate milk from nourishment cabinet. Labs and medications reviewed.  Diet Order:  Diet 2 gram sodium Room service appropriate? Yes; Fluid consistency: Thin  Skin:  Reviewed, no issues  Last BM:  07/22/2016  Height:   Ht Readings from Last 1 Encounters:  07/20/16 5' (1.524 m)    Weight:   Wt Readings from Last 1 Encounters:  07/20/16 112 lb (50.8 kg)    Ideal Body Weight:  45.45 kg  BMI:  Body mass index is 21.87 kg/m.  Estimated  Nutritional Needs:   Kcal:  1250-1500 calories  Protein:  50-60 gm  Fluid:  >/= 1.2L  EDUCATION NEEDS:   No education needs identified at this time  Satira Anis. Evaan Tidwell, MS, RD LDN Inpatient Clinical Dietitian Pager [phone]

## 2016-07-22 NOTE — Care Management Note (Signed)
Case Management Note  Patient Details  Name: Katrina Rose MRN: [id number] Date of Birth: 1961/04/18  Subjective/Objective:       Discussed discharge planning with Katrina Rose. Provided her with coupons for new and changed medications and with an application to the [facility]. She reports that she wants to discuss medication changes with her Cardiologist which she will do on an outpatient basis. Katrina Rose understands that she must get her prescription for flagyl filled today and to take it consistently until it is completed. Katrina Rose has a coupon to get Flagyl at [facility] for $10 to use if she chooses.               Action/Plan:   Expected Discharge Date:                  Expected Discharge Plan:     In-House Referral:     Discharge planning Services     Post Acute Care Choice:    Choice offered to:     DME Arranged:    DME Agency:     HH Arranged:    HH Agency:     Status of Service:     If discussed at [employer] of Stay Meetings, dates discussed:    Additional Comments:  Katrina Rose A, RN 07/22/2016, 2:32 PM

## 2016-07-23 NOTE — Discharge Summary (Signed)
Katrina Rose, is a 55 y.o. female  DOB 25-Feb-1961  MRN [id number].  Admission date:  07/20/2016  Admitting Physician  Vaughan Basta, MD  Discharge Date:  07/23/2016   Primary MD  No primary care provider on file.  Recommendations for primary care physician for things to follow:   F/w  kowalski in one week   Admission Diagnosis  Dehydration [E86.0] C. difficile diarrhea [A04.7]   Discharge Diagnosis  Dehydration [E86.0] C. difficile diarrhea [A04.7]   Principal Problem:   C. difficile diarrhea      Past Medical History:  Diagnosis Date  . AICD (automatic cardioverter/defibrillator) present   . CHF (congestive heart failure) ([place])   . Hypertension   . Insomnia     Past Surgical History:  Procedure Laterality Date  . ANKLE SURGERY Left   . DILATION AND CURETTAGE OF UTERUS    . pacermaker/ defibrilater   2010  . TUBAL LIGATION         History of present illness and  Hospital Course:     Kindly see H&P for history of present illness and admission details, please review complete Labs, Consult reports and Test reports for all details in brief  HPI  from the history and physical done on the day of admission; 55 yr old female admitted for diarrhoea ,ongoing for 4 months.     Hospital Course  diarrhea due to cdiff;improved with flagyl,for 14 days 2.chronic systolic chf;lasix,coreg ware held on admission ,decreased the dose of coreg ,lisinopril were adjusted      Discharge Condition:stable   Follow UP  Follow-up Information    San Jetty, MD. Schedule an appointment as soon as possible for a visit in 1 week(s).   Specialty:  Gastroenterology Why:  For recheck Contact information: [address] [place] [address] [phone]        Corey Skains, MD. Go on  07/29/2016.   Specialty:  Cardiology Why:  follow up appointment scheduled for 2:45 Tuesday 26, 2017 Contact information: [facility] [place] [address] [phone]             Discharge Instructions  and  Discharge Medications       Medication List    STOP taking these medications   furosemide 20 MG tablet Commonly known as:  LASIX     TAKE these medications   aspirin EC 81 MG tablet Take 1 tablet by mouth daily.   carvedilol 3.125 MG tablet Commonly known as:  COREG Take 1 tablet (3.125 mg total) by mouth 2 (two) times daily with a meal. What changed:  medication strength  how much to take  when to take this   lactobacillus acidophilus & bulgar chewable tablet Chew 1 tablet by mouth 3 (three) times daily with meals.   lisinopril 2.5 MG tablet Commonly known as:  ZESTRIL Take 1 tablet (2.5 mg total) by mouth daily. What changed:  medication strength  how much to take  when to take this   metroNIDAZOLE 500 MG tablet Commonly known as:  FLAGYL Take 1 tablet (500 mg total) by mouth every 8 (eight) hours.   spironolactone 25 MG tablet Commonly known as:  ALDACTONE Take 1 tablet by mouth daily.   traZODone 100 MG tablet Commonly known as:  DESYREL Take 100 mg by mouth at bedtime.         Diet and Activity recommendation: See Discharge Instructions above   Consults obtained - none  Major procedures and Radiology Reports - PLEASE review detailed and final reports for all details, in brief -     No results found.  Micro Results   Recent Results (from the past 240 hour(s))  C difficile quick scan w PCR reflex     Status: Abnormal   Collection Time: 07/20/16  3:25 PM  Result Value Ref Range Status   C Diff antigen POSITIVE (A) NEGATIVE Final   C Diff toxin NEGATIVE NEGATIVE Final   C Diff interpretation Results are indeterminate. See PCR results.  Final  Gastrointestinal Panel by PCR ,  Stool     Status: None   Collection Time: 07/20/16  3:25 PM  Result Value Ref Range Status   Campylobacter species NOT DETECTED NOT DETECTED Final   Plesimonas shigelloides NOT DETECTED NOT DETECTED Final   Salmonella species NOT DETECTED NOT DETECTED Final   Yersinia enterocolitica NOT DETECTED NOT DETECTED Final   Vibrio species NOT DETECTED NOT DETECTED Final   Vibrio cholerae NOT DETECTED NOT DETECTED Final   Enteroaggregative E coli (EAEC) NOT DETECTED NOT DETECTED Final   Enteropathogenic E coli (EPEC) NOT DETECTED NOT DETECTED Final   Enterotoxigenic E coli (ETEC) NOT DETECTED NOT DETECTED Final   Shiga like toxin producing E coli (STEC) NOT DETECTED NOT DETECTED Final   E. coli O157 NOT DETECTED NOT DETECTED Final   Shigella/Enteroinvasive E coli (EIEC) NOT DETECTED NOT DETECTED Final   Cryptosporidium NOT DETECTED NOT DETECTED Final   Cyclospora cayetanensis NOT DETECTED NOT DETECTED Final   Entamoeba histolytica NOT DETECTED NOT DETECTED Final   Giardia lamblia NOT DETECTED NOT DETECTED Final   Adenovirus F40/41 NOT DETECTED NOT DETECTED Final   Astrovirus NOT DETECTED NOT DETECTED Final   Norovirus GI/GII NOT DETECTED NOT DETECTED Final   Rotavirus A NOT DETECTED NOT DETECTED Final   Sapovirus (I, II, IV, and V) NOT DETECTED NOT DETECTED Final  Clostridium Difficile by PCR     Status: Abnormal   Collection Time: 07/20/16  3:25 PM  Result Value Ref Range Status   Toxigenic C Difficile by pcr POSITIVE (A) NEGATIVE Final    Comment: Positive for toxigenic C. difficile with little to no toxin production. Only treat if clinical presentation suggests symptomatic illness.       Today   Subjective:   [name] today has no headache,no chest abdominal pain,no new weakness tingling or numbness, feels much better wants to go home today.   Objective:   Blood pressure 96/64, pulse 93, temperature 98 F (36.7 C), temperature source Oral, resp. rate 12, height 5' (1.524  m), weight 50.8 kg (112 lb), SpO2 100 %.  No intake or output data in the 24 hours ending 07/23/16 2151  Exam Awake Alert, Oriented x 3, No new F.N deficits, Normal affect [place].AT,PERRAL Supple Neck,No JVD, No cervical lymphadenopathy appriciated.  Symmetrical Chest wall movement, Good air movement bilaterally, CTAB RRR,No Gallops,Rubs or new Murmurs, No Parasternal Heave +ve B.Sounds, Abd Soft, Non tender, No organomegaly appriciated, No rebound -guarding or rigidity. No Cyanosis, Clubbing or edema, No new Rash or bruise  Data Review   CBC w Diff:  Lab Results  Component Value Date   WBC 4.2 07/22/2016   HGB 8.6 (L) 07/22/2016   HGB 12.2 09/21/2014   HCT 26.1 (L) 07/22/2016   HCT 38.8 09/21/2014   PLT 308 07/22/2016   PLT 342 09/21/2014   LYMPHOPCT 11.9 09/21/2014   MONOPCT 5.0 09/21/2014   EOSPCT 2.9 09/21/2014  BASOPCT 0.4 09/21/2014    CMP:  Lab Results  Component Value Date   NA 140 07/21/2016   NA 139 09/21/2014   K 3.9 07/21/2016   K 3.9 09/21/2014   CL 115 (H) 07/21/2016   CL 105 09/21/2014   CO2 18 (L) 07/21/2016   CO2 29 09/21/2014   BUN 15 07/21/2016   BUN 19 (H) 09/21/2014   CREATININE 0.83 07/21/2016   CREATININE 0.89 09/21/2014   PROT 7.9 07/20/2016   PROT 6.6 06/01/2012   ALBUMIN 4.2 07/20/2016   ALBUMIN 3.1 (L) 06/01/2012   BILITOT 0.3 07/20/2016   BILITOT 0.2 06/01/2012   ALKPHOS 83 07/20/2016   ALKPHOS 77 06/01/2012   AST 18 07/20/2016   AST 26 06/01/2012   ALT 13 (L) 07/20/2016   ALT 15 06/01/2012  .   Total Time in preparing paper work, data evaluation and todays exam - 47 minutes  Carols Clemence M.D on 07/23/2016 at 9:51 PM    Note: This dictation was prepared with Dragon dictation along with smaller phrase technology. Any transcriptional errors that result from this process are unintentional.

## 2017-02-20 ENCOUNTER — Encounter: Payer: Self-pay | Admitting: *Deleted

## 2017-02-20 ENCOUNTER — Emergency Department
Admission: EM | Admit: 2017-02-20 | Discharge: 2017-02-20 | Disposition: A | Discharge status: Home / Self Care | Payer: Self-pay | Attending: Emergency Medicine | Admitting: Emergency Medicine

## 2017-02-20 ENCOUNTER — Emergency Department: Payer: Self-pay

## 2017-02-20 DIAGNOSIS — I509 Heart failure, unspecified: Secondary | ICD-10-CM | POA: Insufficient documentation

## 2017-02-20 DIAGNOSIS — Z87891 Personal history of nicotine dependence: Secondary | ICD-10-CM | POA: Insufficient documentation

## 2017-02-20 DIAGNOSIS — R110 Nausea: Secondary | ICD-10-CM | POA: Insufficient documentation

## 2017-02-20 DIAGNOSIS — I110 Hypertensive heart disease with heart failure: Secondary | ICD-10-CM | POA: Insufficient documentation

## 2017-02-20 DIAGNOSIS — Z79899 Other long term (current) drug therapy: Secondary | ICD-10-CM | POA: Insufficient documentation

## 2017-02-20 DIAGNOSIS — Z7982 Long term (current) use of aspirin: Secondary | ICD-10-CM | POA: Insufficient documentation

## 2017-02-20 DIAGNOSIS — R079 Chest pain, unspecified: Secondary | ICD-10-CM

## 2017-02-20 LAB — BASIC METABOLIC PANEL
Anion gap: 10 (ref 5–15)
BUN: 14 mg/dL (ref 6–20)
CHLORIDE: 96 mmol/L — AB (ref 101–111)
CO2: 33 mmol/L — ABNORMAL HIGH (ref 22–32)
Calcium: 10 mg/dL (ref 8.9–10.3)
Creatinine, Ser: 0.97 mg/dL (ref 0.44–1.00)
GFR calc non Af Amer: >60 mL/min (ref >60)
Glucose, Bld: 115 mg/dL — ABNORMAL HIGH (ref 65–99)
Potassium: 2.7 mmol/L — CL (ref 3.5–5.1)
SODIUM: 139 mmol/L (ref 135–145)

## 2017-02-20 LAB — MAGNESIUM: MAGNESIUM: 1.9 mg/dL (ref 1.7–2.4)

## 2017-02-20 LAB — TROPONIN I
Troponin I: <0.03 ng/mL (ref <0.03)
Troponin I: <0.03 ng/mL (ref <0.03)

## 2017-02-20 LAB — CBC
HCT: 38.2 % (ref 35.0–47.0)
HEMOGLOBIN: 12.6 g/dL (ref 12.0–16.0)
MCH: 24.9 pg — ABNORMAL LOW (ref 26.0–34.0)
MCHC: 33 g/dL (ref 32.0–36.0)
MCV: 75.5 fL — AB (ref 80.0–100.0)
Platelets: 387 10*3/uL (ref 150–440)
RBC: 5.05 MIL/uL (ref 3.80–5.20)
RDW: 17.8 % — ABNORMAL HIGH (ref 11.5–14.5)
WBC: 10.6 10*3/uL (ref 3.6–11.0)

## 2017-02-20 MED ORDER — ONDANSETRON HCL 4 MG/2ML IJ SOLN
4.0000 mg | Freq: Once | INTRAMUSCULAR | Status: AC
  Administered 2017-02-20: 4 mg via INTRAVENOUS
  Filled 2017-02-20: qty 2

## 2017-02-20 MED ORDER — POTASSIUM CHLORIDE CRYS ER 20 MEQ PO TBCR
60.0000 meq | EXTENDED_RELEASE_TABLET | Freq: Once | ORAL | Status: AC
  Administered 2017-02-20: 60 meq via ORAL
  Filled 2017-02-20: qty 3

## 2017-02-20 MED ORDER — MORPHINE SULFATE (PF) 4 MG/ML IV SOLN
4.0000 mg | Freq: Once | INTRAVENOUS | Status: AC
  Administered 2017-02-20: 4 mg via INTRAVENOUS
  Filled 2017-02-20: qty 1

## 2017-02-20 NOTE — Discharge Instructions (Signed)
I spoke with Dr Ubaldo Glassing. They will see you in the office Monday at 830AM.  Please return if worse.

## 2017-02-20 NOTE — ED Provider Notes (Signed)
[facility] Emergency Department Provider Note       Time seen: ----------------------------------------- 3:10 PM on 02/20/2017 -----------------------------------------     I have reviewed the triage vital signs and the nursing notes.   HISTORY   Chief Complaint Chest Pain    HPI Katrina Rose is a 56 y.o. female who presents to the ED for left-sided chest pain that started on Wednesday. Patient had vomiting all day yesterday but the vomiting seem to resolve this morning. Patient had been seen this month by her cardiologist for chest pain of uncertain etiology. Currently the pain is 4/10 in the left chest, described as an ache.    Past Medical History:  Diagnosis Date  . AICD (automatic cardioverter/defibrillator) present   . CHF (congestive heart failure) ([place])   . Hypertension   . Insomnia     Patient Active Problem List   Diagnosis Date Noted  . C. difficile diarrhea 07/20/2016    Past Surgical History:  Procedure Laterality Date  . ANKLE SURGERY Left   . DILATION AND CURETTAGE OF UTERUS    . pacermaker/ defibrilater   2010  . TUBAL LIGATION      Allergies Sulfa antibiotics  Social History Social History  Substance Use Topics  . Smoking status: Former Research scientist (life sciences)  . Smokeless tobacco: Never Used  . Alcohol use Yes     Comment: ocas.   Review of Systems Constitutional: Negative for fever. Cardiovascular: Positive for chest pain Respiratory: Negative for shortness of breath. Gastrointestinal: Negative for abdominal pain, positive for vomiting Genitourinary: Negative for dysuria. Musculoskeletal: Negative for back pain. Skin: Negative for rash. Neurological: Negative for headaches, focal weakness or numbness.  10-point ROS otherwise negative.  ____________________________________________   PHYSICAL EXAM:  VITAL SIGNS: ED Triage Vitals  Enc Vitals Group     BP 02/20/17 1313 115/78     Pulse Rate 02/20/17 1313 (!) 106      Resp 02/20/17 1313 18     Temp 02/20/17 1313 98.5 F (36.9 C)     Temp Source 02/20/17 1313 Oral     SpO2 02/20/17 1313 97 %     Weight 02/20/17 1313 116 lb (52.6 kg)     Height 02/20/17 1313 5' (1.524 m)     Head Circumference --      Peak Flow --      Pain Score 02/20/17 1310 4     Pain Loc --      Pain Edu? --      Excl. in [place]? --    Constitutional: Alert and oriented. Well appearing and in no distress. Eyes: Conjunctivae are normal. PERRL. Normal extraocular movements. ENT   Head: Normocephalic and atraumatic.   Nose: No congestion/rhinnorhea.   Mouth/Throat: Mucous membranes are moist.   Neck: No stridor. Cardiovascular: Normal rate, regular rhythm. No murmurs, rubs, or gallops. Respiratory: Normal respiratory effort without tachypnea nor retractions. Breath sounds are clear and equal bilaterally. No wheezes/rales/rhonchi. Gastrointestinal: Soft and nontender. Normal bowel sounds Musculoskeletal: Nontender with normal range of motion in extremities. No lower extremity tenderness nor edema. Neurologic:  Normal speech and language. No gross focal neurologic deficits are appreciated.  Skin:  Skin is warm, dry and intact. No rash noted. Psychiatric: Mood and affect are normal. Speech and behavior are normal.  ____________________________________________  EKG: Interpreted by me. Atrial sensing ventricular paced rhythm  ____________________________________________  ED COURSE:  Pertinent labs & imaging results that were available during my care of the patient were reviewed by  me and considered in my medical decision making (see chart for details). Patient presents for chest pain, we will assess with labs and imaging as indicated.   Procedures ____________________________________________   LABS (pertinent positives/negatives)  Labs Reviewed  BASIC METABOLIC PANEL - Abnormal; Notable for the following:       Result Value   Potassium 2.7 (*)    Chloride 96  (*)    CO2 33 (*)    Glucose, Bld 115 (*)    All other components within normal limits  CBC - Abnormal; Notable for the following:    MCV 75.5 (*)    MCH 24.9 (*)    RDW 17.8 (*)    All other components within normal limits  TROPONIN I  MAGNESIUM  TROPONIN I    RADIOLOGY  Chest X-ray IMPRESSION: No active cardiopulmonary disease.   ____________________________________________  FINAL ASSESSMENT AND PLAN  Chest Pain  Plan: Patient's labs and imaging were dictated above. Patient had presented for chest pain of uncertain etiology. Initial workup has been negative. Repeat troponin is pending.   Earleen Newport, MD   Note: This note was generated in part or whole with voice recognition software. Voice recognition is usually quite accurate but there are transcription errors that can and very often do occur. I apologize for any typographical errors that were not detected and corrected.     Earleen Newport, MD 02/20/17 1536

## 2017-02-20 NOTE — ED Triage Notes (Signed)
Pt complains of left sided chest pain with nausea starting on Wednesday, pt denies any other symptoms

## 2017-02-20 NOTE — ED Notes (Signed)
Date and time results received: 02/20/17 1352 (use smartphrase ".now" to insert current time)  Test: k Critical Value: 2.7  Name of Provider Notified: Dr Mable Paris  Orders Received? Or Actions Taken?: no new orders obtained

## 2019-09-21 ENCOUNTER — Ambulatory Visit
Admission: RE | Admit: 2019-09-21 | Discharge: 2019-09-21 | Disposition: A | Discharge status: Home / Self Care | Payer: Medicare Other | Source: Ambulatory Visit | Attending: Family Medicine | Admitting: Family Medicine

## 2019-09-21 ENCOUNTER — Other Ambulatory Visit: Payer: Self-pay | Admitting: Family Medicine

## 2019-09-21 DIAGNOSIS — Z1231 Encounter for screening mammogram for malignant neoplasm of breast: Secondary | ICD-10-CM | POA: Diagnosis present

## 2019-12-15 DIAGNOSIS — I340 Nonrheumatic mitral (valve) insufficiency: Secondary | ICD-10-CM | POA: Diagnosis not present

## 2019-12-15 DIAGNOSIS — I25119 Atherosclerotic heart disease of native coronary artery with unspecified angina pectoris: Secondary | ICD-10-CM | POA: Diagnosis not present

## 2019-12-15 DIAGNOSIS — I5022 Chronic systolic (congestive) heart failure: Secondary | ICD-10-CM | POA: Diagnosis not present

## 2019-12-15 DIAGNOSIS — I447 Left bundle-branch block, unspecified: Secondary | ICD-10-CM | POA: Diagnosis not present

## 2019-12-15 DIAGNOSIS — Z95810 Presence of automatic (implantable) cardiac defibrillator: Secondary | ICD-10-CM | POA: Diagnosis not present

## 2019-12-15 DIAGNOSIS — E782 Mixed hyperlipidemia: Secondary | ICD-10-CM | POA: Diagnosis not present

## 2020-01-20 DIAGNOSIS — Z87891 Personal history of nicotine dependence: Secondary | ICD-10-CM | POA: Diagnosis not present

## 2020-01-20 DIAGNOSIS — G5702 Lesion of sciatic nerve, left lower limb: Secondary | ICD-10-CM | POA: Diagnosis not present

## 2020-02-16 ENCOUNTER — Other Ambulatory Visit: Payer: Self-pay

## 2020-02-16 ENCOUNTER — Ambulatory Visit: Payer: Medicare HMO | Attending: Internal Medicine

## 2020-02-16 DIAGNOSIS — Z23 Encounter for immunization: Secondary | ICD-10-CM

## 2020-02-16 NOTE — Progress Notes (Signed)
   Covid-19 Vaccination Clinic  Name:  Katrina Rose    MRN: [id number] DOB: 11-20-1960  02/16/2020  Katrina Rose was observed post Covid-19 immunization for 15 minutes without incident. She was provided with Vaccine Information Sheet and instruction to access the V-Safe system.   Katrina Rose was instructed to call 911 with any severe reactions post vaccine: Marland Kitchen Difficulty breathing  . Swelling of face and throat  . A fast heartbeat  . A bad rash all over body  . Dizziness and weakness   Immunizations Administered    Name Date Dose VIS Date Route   Pfizer COVID-19 Vaccine 02/16/2020  9:37 AM 0.3 mL 10/14/2019 Intramuscular   Manufacturer: [place]   Lot: [id number]   [place]: [id number]

## 2020-03-13 ENCOUNTER — Ambulatory Visit: Payer: Medicare HMO | Attending: Internal Medicine

## 2020-03-13 DIAGNOSIS — Z23 Encounter for immunization: Secondary | ICD-10-CM

## 2020-03-13 NOTE — Progress Notes (Signed)
   Covid-19 Vaccination Clinic  Name:  ARTIE TAKAYAMA    MRN: [id number] DOB: 08/27/1961  03/13/2020  Ms. Segall was observed post Covid-19 immunization for 15 minutes without incident. She was provided with Vaccine Information Sheet and instruction to access the V-Safe system.   Ms. Caponigro was instructed to call 911 with any severe reactions post vaccine: Marland Kitchen Difficulty breathing  . Swelling of face and throat  . A fast heartbeat  . A bad rash all over body  . Dizziness and weakness   Immunizations Administered    Name Date Dose VIS Date Route   Pfizer COVID-19 Vaccine 03/13/2020  9:26 AM 0.3 mL 12/28/2018 Intramuscular   Manufacturer: [place]   Lot: [id number]   [place]: [id number]

## 2020-04-05 DIAGNOSIS — Z79899 Other long term (current) drug therapy: Secondary | ICD-10-CM | POA: Diagnosis not present

## 2020-04-05 DIAGNOSIS — R739 Hyperglycemia, unspecified: Secondary | ICD-10-CM | POA: Diagnosis not present

## 2020-04-05 DIAGNOSIS — E7800 Pure hypercholesterolemia, unspecified: Secondary | ICD-10-CM | POA: Diagnosis not present

## 2020-04-10 DIAGNOSIS — N1831 Chronic kidney disease, stage 3a: Secondary | ICD-10-CM | POA: Diagnosis not present

## 2020-04-10 DIAGNOSIS — I429 Cardiomyopathy, unspecified: Secondary | ICD-10-CM | POA: Diagnosis not present

## 2020-04-10 DIAGNOSIS — E782 Mixed hyperlipidemia: Secondary | ICD-10-CM | POA: Diagnosis not present

## 2020-04-10 DIAGNOSIS — Z79899 Other long term (current) drug therapy: Secondary | ICD-10-CM | POA: Diagnosis not present

## 2020-04-10 DIAGNOSIS — I5022 Chronic systolic (congestive) heart failure: Secondary | ICD-10-CM | POA: Diagnosis not present

## 2020-04-10 DIAGNOSIS — F3341 Major depressive disorder, recurrent, in partial remission: Secondary | ICD-10-CM | POA: Diagnosis not present

## 2020-04-10 DIAGNOSIS — G5702 Lesion of sciatic nerve, left lower limb: Secondary | ICD-10-CM | POA: Diagnosis not present

## 2020-04-10 DIAGNOSIS — I10 Essential (primary) hypertension: Secondary | ICD-10-CM | POA: Diagnosis not present

## 2020-04-10 DIAGNOSIS — R739 Hyperglycemia, unspecified: Secondary | ICD-10-CM | POA: Diagnosis not present

## 2020-10-04 DIAGNOSIS — R300 Dysuria: Secondary | ICD-10-CM | POA: Diagnosis not present

## 2020-10-04 DIAGNOSIS — R399 Unspecified symptoms and signs involving the genitourinary system: Secondary | ICD-10-CM | POA: Diagnosis not present

## 2020-10-04 DIAGNOSIS — E782 Mixed hyperlipidemia: Secondary | ICD-10-CM | POA: Diagnosis not present

## 2020-10-04 DIAGNOSIS — R8290 Unspecified abnormal findings in urine: Secondary | ICD-10-CM | POA: Diagnosis not present

## 2020-10-04 DIAGNOSIS — Z79899 Other long term (current) drug therapy: Secondary | ICD-10-CM | POA: Diagnosis not present

## 2020-10-12 DIAGNOSIS — Z0000 Encounter for general adult medical examination without abnormal findings: Secondary | ICD-10-CM | POA: Diagnosis not present

## 2020-10-12 DIAGNOSIS — R739 Hyperglycemia, unspecified: Secondary | ICD-10-CM | POA: Diagnosis not present

## 2020-10-12 DIAGNOSIS — F329 Major depressive disorder, single episode, unspecified: Secondary | ICD-10-CM | POA: Diagnosis not present

## 2020-10-12 DIAGNOSIS — Z1331 Encounter for screening for depression: Secondary | ICD-10-CM | POA: Diagnosis not present

## 2020-10-12 DIAGNOSIS — I509 Heart failure, unspecified: Secondary | ICD-10-CM | POA: Diagnosis not present

## 2020-10-12 DIAGNOSIS — E785 Hyperlipidemia, unspecified: Secondary | ICD-10-CM | POA: Diagnosis not present

## 2020-10-12 DIAGNOSIS — Z23 Encounter for immunization: Secondary | ICD-10-CM | POA: Diagnosis not present

## 2020-10-12 DIAGNOSIS — I110 Hypertensive heart disease with heart failure: Secondary | ICD-10-CM | POA: Diagnosis not present

## 2020-10-12 DIAGNOSIS — D649 Anemia, unspecified: Secondary | ICD-10-CM | POA: Diagnosis not present

## 2020-10-24 DIAGNOSIS — I10 Essential (primary) hypertension: Secondary | ICD-10-CM | POA: Diagnosis not present

## 2020-10-24 DIAGNOSIS — I5022 Chronic systolic (congestive) heart failure: Secondary | ICD-10-CM | POA: Diagnosis not present

## 2020-10-24 DIAGNOSIS — E782 Mixed hyperlipidemia: Secondary | ICD-10-CM | POA: Diagnosis not present

## 2020-11-08 ENCOUNTER — Other Ambulatory Visit: Payer: Self-pay | Admitting: Family Medicine

## 2020-11-08 DIAGNOSIS — Z1231 Encounter for screening mammogram for malignant neoplasm of breast: Secondary | ICD-10-CM

## 2020-12-03 ENCOUNTER — Ambulatory Visit
Admission: RE | Admit: 2020-12-03 | Discharge: 2020-12-03 | Disposition: A | Discharge status: Home / Self Care | Payer: Medicare HMO | Source: Ambulatory Visit | Attending: Family Medicine | Admitting: Family Medicine

## 2020-12-03 ENCOUNTER — Other Ambulatory Visit: Payer: Self-pay

## 2020-12-03 DIAGNOSIS — Z1231 Encounter for screening mammogram for malignant neoplasm of breast: Secondary | ICD-10-CM | POA: Diagnosis not present

## 2021-04-08 DIAGNOSIS — Z79899 Other long term (current) drug therapy: Secondary | ICD-10-CM | POA: Diagnosis not present

## 2021-04-08 DIAGNOSIS — R739 Hyperglycemia, unspecified: Secondary | ICD-10-CM | POA: Diagnosis not present

## 2021-04-08 DIAGNOSIS — E7800 Pure hypercholesterolemia, unspecified: Secondary | ICD-10-CM | POA: Diagnosis not present

## 2021-05-13 DIAGNOSIS — Z1211 Encounter for screening for malignant neoplasm of colon: Secondary | ICD-10-CM | POA: Diagnosis not present

## 2021-05-13 DIAGNOSIS — R739 Hyperglycemia, unspecified: Secondary | ICD-10-CM | POA: Diagnosis not present

## 2021-05-13 DIAGNOSIS — I509 Heart failure, unspecified: Secondary | ICD-10-CM | POA: Diagnosis not present

## 2021-05-13 DIAGNOSIS — F32A Depression, unspecified: Secondary | ICD-10-CM | POA: Diagnosis not present

## 2021-05-13 DIAGNOSIS — Z79899 Other long term (current) drug therapy: Secondary | ICD-10-CM | POA: Diagnosis not present

## 2021-05-13 DIAGNOSIS — E785 Hyperlipidemia, unspecified: Secondary | ICD-10-CM | POA: Diagnosis not present

## 2021-05-13 DIAGNOSIS — I130 Hypertensive heart and chronic kidney disease with heart failure and stage 1 through stage 4 chronic kidney disease, or unspecified chronic kidney disease: Secondary | ICD-10-CM | POA: Diagnosis not present

## 2021-05-13 DIAGNOSIS — I429 Cardiomyopathy, unspecified: Secondary | ICD-10-CM | POA: Diagnosis not present

## 2021-05-13 DIAGNOSIS — N189 Chronic kidney disease, unspecified: Secondary | ICD-10-CM | POA: Diagnosis not present

## 2021-05-14 DIAGNOSIS — I5022 Chronic systolic (congestive) heart failure: Secondary | ICD-10-CM | POA: Diagnosis not present

## 2021-07-09 DIAGNOSIS — I447 Left bundle-branch block, unspecified: Secondary | ICD-10-CM | POA: Diagnosis not present

## 2021-07-09 DIAGNOSIS — I5022 Chronic systolic (congestive) heart failure: Secondary | ICD-10-CM | POA: Diagnosis not present

## 2021-07-09 DIAGNOSIS — I10 Essential (primary) hypertension: Secondary | ICD-10-CM | POA: Diagnosis not present

## 2021-07-09 DIAGNOSIS — I420 Dilated cardiomyopathy: Secondary | ICD-10-CM | POA: Diagnosis not present

## 2021-07-09 DIAGNOSIS — R413 Other amnesia: Secondary | ICD-10-CM | POA: Diagnosis not present

## 2021-07-09 DIAGNOSIS — E782 Mixed hyperlipidemia: Secondary | ICD-10-CM | POA: Diagnosis not present

## 2021-07-09 DIAGNOSIS — Z01818 Encounter for other preprocedural examination: Secondary | ICD-10-CM | POA: Diagnosis not present

## 2021-07-31 DIAGNOSIS — R194 Change in bowel habit: Secondary | ICD-10-CM | POA: Diagnosis not present

## 2021-07-31 DIAGNOSIS — I5022 Chronic systolic (congestive) heart failure: Secondary | ICD-10-CM | POA: Diagnosis not present

## 2021-07-31 DIAGNOSIS — K219 Gastro-esophageal reflux disease without esophagitis: Secondary | ICD-10-CM | POA: Diagnosis not present

## 2021-07-31 DIAGNOSIS — D509 Iron deficiency anemia, unspecified: Secondary | ICD-10-CM | POA: Diagnosis not present

## 2021-08-01 ENCOUNTER — Ambulatory Visit
Admission: RE | Admit: 2021-08-01 | Discharge: 2021-08-01 | Disposition: A | Discharge status: Home / Self Care | Payer: Medicare HMO | Source: Ambulatory Visit | Attending: Cardiology | Admitting: Cardiology

## 2021-08-01 ENCOUNTER — Other Ambulatory Visit: Payer: Self-pay

## 2021-08-01 ENCOUNTER — Encounter: Payer: Self-pay | Admitting: Cardiology

## 2021-08-01 ENCOUNTER — Encounter
Admission: RE | Disposition: A | Discharge status: Home / Self Care | Payer: Self-pay | Source: Ambulatory Visit | Attending: Cardiology

## 2021-08-01 DIAGNOSIS — T82191A Other mechanical complication of cardiac pulse generator (battery), initial encounter: Secondary | ICD-10-CM | POA: Diagnosis not present

## 2021-08-01 DIAGNOSIS — Z95810 Presence of automatic (implantable) cardiac defibrillator: Secondary | ICD-10-CM | POA: Diagnosis not present

## 2021-08-01 DIAGNOSIS — Z4502 Encounter for adjustment and management of automatic implantable cardiac defibrillator: Secondary | ICD-10-CM | POA: Insufficient documentation

## 2021-08-01 DIAGNOSIS — Z45010 Encounter for checking and testing of cardiac pacemaker pulse generator [battery]: Secondary | ICD-10-CM

## 2021-08-01 HISTORY — PX: ICD GENERATOR CHANGEOUT: EP1231

## 2021-08-01 SURGERY — ICD GENERATOR CHANGEOUT
Anesthesia: Moderate Sedation

## 2021-08-01 MED ORDER — CEFAZOLIN SODIUM-DEXTROSE 2-4 GM/100ML-% IV SOLN
INTRAVENOUS | Status: AC
  Filled 2021-08-01: qty 100

## 2021-08-01 MED ORDER — MIDAZOLAM HCL 2 MG/2ML IJ SOLN
INTRAMUSCULAR | Status: AC
  Filled 2021-08-01: qty 2

## 2021-08-01 MED ORDER — CEPHALEXIN 250 MG PO CAPS: 500.0000 mg | ORAL_CAPSULE | Freq: Two times a day (BID) | ORAL | 0 refills | Status: DC

## 2021-08-01 MED ORDER — LIDOCAINE HCL 1 % IJ SOLN
INTRAMUSCULAR | Status: AC
  Filled 2021-08-01: qty 20

## 2021-08-01 MED ORDER — CEFAZOLIN SODIUM-DEXTROSE 2-4 GM/100ML-% IV SOLN: 2.0000 g | INTRAVENOUS | Status: DC

## 2021-08-01 MED ORDER — FENTANYL CITRATE (PF) 100 MCG/2ML IJ SOLN
INTRAMUSCULAR | Status: DC | PRN
  Administered 2021-08-01: 25 ug via INTRAVENOUS

## 2021-08-01 MED ORDER — CEFAZOLIN SODIUM-DEXTROSE 1-4 GM/50ML-% IV SOLN
INTRAVENOUS | Status: AC | PRN
  Administered 2021-08-01: 2 g via INTRAVENOUS

## 2021-08-01 MED ORDER — ONDANSETRON HCL 4 MG/2ML IJ SOLN: 4.0000 mg | Freq: Four times a day (QID) | INTRAMUSCULAR | Status: DC | PRN

## 2021-08-01 MED ORDER — MIDAZOLAM HCL 2 MG/2ML IJ SOLN
INTRAMUSCULAR | Status: DC | PRN
  Administered 2021-08-01: .5 mg via INTRAVENOUS

## 2021-08-01 MED ORDER — SODIUM CHLORIDE 0.9 % IV SOLN: INTRAVENOUS | Status: DC | PRN

## 2021-08-01 MED ORDER — SODIUM CHLORIDE 0.9 % IV SOLN
INTRAVENOUS | Status: AC | PRN
  Administered 2021-08-01: 500 mL/h via INTRAVENOUS

## 2021-08-01 MED ORDER — SODIUM CHLORIDE 0.9 % IV SOLN: INTRAVENOUS | Status: DC

## 2021-08-01 MED ORDER — SODIUM CHLORIDE 0.9 % IV SOLN
80.0000 mg | INTRAVENOUS | Status: DC
  Filled 2021-08-01: qty 2

## 2021-08-01 MED ORDER — FENTANYL CITRATE (PF) 100 MCG/2ML IJ SOLN
INTRAMUSCULAR | Status: AC
  Filled 2021-08-01: qty 2

## 2021-08-01 MED ORDER — HEPARIN (PORCINE) IN NACL 1000-0.9 UT/500ML-% IV SOLN
INTRAVENOUS | Status: AC
  Filled 2021-08-01: qty 1000

## 2021-08-01 MED ORDER — ACETAMINOPHEN 325 MG PO TABS: 325.0000 mg | ORAL_TABLET | ORAL | Status: DC | PRN

## 2021-08-01 SURGICAL SUPPLY — 19 items
CABLE SURG 12 DISP A/V CHANNEL (MISCELLANEOUS) ×2 IMPLANT
COVER SURGICAL LIGHT HANDLE (MISCELLANEOUS) ×2 IMPLANT
DEVICE DSSCT PLSMBLD 3.0S LGHT (MISCELLANEOUS) ×1 IMPLANT
DRAPE INCISE 23X17 IOBAN STRL (DRAPES) ×1
DRAPE INCISE IOBAN 23X17 STRL (DRAPES) ×1 IMPLANT
ELECT REM PT RETURN 9FT ADLT (ELECTROSURGICAL) ×2
ELECTRODE REM PT RTRN 9FT ADLT (ELECTROSURGICAL) ×1 IMPLANT
ICD CLARIA MRI DTMA1D1 (ICD Generator) ×2 IMPLANT
KIT SYRINGE INJ CVI SPIKEX1 (MISCELLANEOUS) ×2 IMPLANT
KIT WRENCH (KITS) ×2 IMPLANT
PAD ELECT DEFIB RADIOL ZOLL (MISCELLANEOUS) ×2 IMPLANT
PLASMABLADE 3.0S W/LIGHT (MISCELLANEOUS) ×2
SUT VIC AB 2-0 CT1 27 (SUTURE) ×1
SUT VIC AB 2-0 CT1 TAPERPNT 27 (SUTURE) ×1 IMPLANT
SUT VICRYL 4-0  27 PS-2 BARIAT (SUTURE) ×1
SUT VICRYL 4-0 27 PS-2 BARIAT (SUTURE) ×1
SUTURE VICRYL 4-0 27 PS-2 BART (SUTURE) ×1 IMPLANT
SYR CONTROL 10ML ANGIOGRAPHIC (SYRINGE) ×2 IMPLANT
TRAY PACEMAKER INSERTION (PACKS) ×2 IMPLANT

## 2021-08-01 NOTE — Discharge Instructions (Signed)
Patient may shower 08/03/2021.  Patient may remove outer bandage 08/03/2021, leave Steri-Strips on.

## 2021-08-01 NOTE — Progress Notes (Signed)
Dr. Saralyn Pilar in at bedside to speak with pt. And her daughter re: new generator placement. [employer] rep. Also discussed with pt. And daughter post op instructions .

## 2021-08-12 DIAGNOSIS — Z95810 Presence of automatic (implantable) cardiac defibrillator: Secondary | ICD-10-CM | POA: Diagnosis not present

## 2021-08-12 DIAGNOSIS — I420 Dilated cardiomyopathy: Secondary | ICD-10-CM | POA: Diagnosis not present

## 2021-08-12 DIAGNOSIS — I10 Essential (primary) hypertension: Secondary | ICD-10-CM | POA: Diagnosis not present

## 2021-08-12 DIAGNOSIS — N1831 Chronic kidney disease, stage 3a: Secondary | ICD-10-CM | POA: Diagnosis not present

## 2021-08-12 DIAGNOSIS — I447 Left bundle-branch block, unspecified: Secondary | ICD-10-CM | POA: Diagnosis not present

## 2021-08-12 DIAGNOSIS — I5022 Chronic systolic (congestive) heart failure: Secondary | ICD-10-CM | POA: Diagnosis not present

## 2021-08-27 ENCOUNTER — Encounter: Payer: Self-pay | Admitting: Cardiology

## 2021-10-04 ENCOUNTER — Encounter: Payer: Self-pay | Admitting: Gastroenterology

## 2021-10-07 ENCOUNTER — Encounter
Admission: RE | Disposition: A | Discharge status: Home / Self Care | Payer: Self-pay | Source: Ambulatory Visit | Attending: Gastroenterology

## 2021-10-07 ENCOUNTER — Ambulatory Visit: Payer: Medicare HMO | Admitting: Anesthesiology

## 2021-10-07 ENCOUNTER — Ambulatory Visit
Admission: RE | Admit: 2021-10-07 | Discharge: 2021-10-07 | Disposition: A | Discharge status: Home / Self Care | Payer: Medicare HMO | Source: Ambulatory Visit | Attending: Gastroenterology | Admitting: Gastroenterology

## 2021-10-07 ENCOUNTER — Encounter: Payer: Self-pay | Admitting: Gastroenterology

## 2021-10-07 DIAGNOSIS — F17200 Nicotine dependence, unspecified, uncomplicated: Secondary | ICD-10-CM | POA: Insufficient documentation

## 2021-10-07 DIAGNOSIS — K2980 Duodenitis without bleeding: Secondary | ICD-10-CM | POA: Insufficient documentation

## 2021-10-07 DIAGNOSIS — K5730 Diverticulosis of large intestine without perforation or abscess without bleeding: Secondary | ICD-10-CM | POA: Insufficient documentation

## 2021-10-07 DIAGNOSIS — I130 Hypertensive heart and chronic kidney disease with heart failure and stage 1 through stage 4 chronic kidney disease, or unspecified chronic kidney disease: Secondary | ICD-10-CM | POA: Diagnosis not present

## 2021-10-07 DIAGNOSIS — D631 Anemia in chronic kidney disease: Secondary | ICD-10-CM | POA: Diagnosis not present

## 2021-10-07 DIAGNOSIS — R194 Change in bowel habit: Secondary | ICD-10-CM | POA: Diagnosis not present

## 2021-10-07 DIAGNOSIS — N189 Chronic kidney disease, unspecified: Secondary | ICD-10-CM | POA: Diagnosis not present

## 2021-10-07 DIAGNOSIS — Z95810 Presence of automatic (implantable) cardiac defibrillator: Secondary | ICD-10-CM | POA: Diagnosis not present

## 2021-10-07 DIAGNOSIS — D509 Iron deficiency anemia, unspecified: Secondary | ICD-10-CM | POA: Insufficient documentation

## 2021-10-07 DIAGNOSIS — K621 Rectal polyp: Secondary | ICD-10-CM | POA: Diagnosis not present

## 2021-10-07 DIAGNOSIS — K449 Diaphragmatic hernia without obstruction or gangrene: Secondary | ICD-10-CM | POA: Insufficient documentation

## 2021-10-07 DIAGNOSIS — K2289 Other specified disease of esophagus: Secondary | ICD-10-CM | POA: Diagnosis not present

## 2021-10-07 DIAGNOSIS — K635 Polyp of colon: Secondary | ICD-10-CM | POA: Insufficient documentation

## 2021-10-07 DIAGNOSIS — D649 Anemia, unspecified: Secondary | ICD-10-CM | POA: Diagnosis not present

## 2021-10-07 DIAGNOSIS — K2100 Gastro-esophageal reflux disease with esophagitis, without bleeding: Secondary | ICD-10-CM | POA: Diagnosis not present

## 2021-10-07 DIAGNOSIS — K5790 Diverticulosis of intestine, part unspecified, without perforation or abscess without bleeding: Secondary | ICD-10-CM | POA: Diagnosis not present

## 2021-10-07 DIAGNOSIS — I509 Heart failure, unspecified: Secondary | ICD-10-CM | POA: Diagnosis not present

## 2021-10-07 DIAGNOSIS — I447 Left bundle-branch block, unspecified: Secondary | ICD-10-CM | POA: Insufficient documentation

## 2021-10-07 DIAGNOSIS — K640 First degree hemorrhoids: Secondary | ICD-10-CM | POA: Diagnosis not present

## 2021-10-07 DIAGNOSIS — K3189 Other diseases of stomach and duodenum: Secondary | ICD-10-CM | POA: Diagnosis not present

## 2021-10-07 DIAGNOSIS — K219 Gastro-esophageal reflux disease without esophagitis: Secondary | ICD-10-CM | POA: Diagnosis not present

## 2021-10-07 DIAGNOSIS — K649 Unspecified hemorrhoids: Secondary | ICD-10-CM | POA: Diagnosis not present

## 2021-10-07 DIAGNOSIS — K529 Noninfective gastroenteritis and colitis, unspecified: Secondary | ICD-10-CM | POA: Diagnosis not present

## 2021-10-07 HISTORY — PX: ESOPHAGOGASTRODUODENOSCOPY (EGD) WITH PROPOFOL: SHX5813

## 2021-10-07 HISTORY — DX: Left bundle-branch block, unspecified: I44.7

## 2021-10-07 HISTORY — PX: COLONOSCOPY WITH PROPOFOL: SHX5780

## 2021-10-07 HISTORY — DX: Gastro-esophageal reflux disease without esophagitis: K21.9

## 2021-10-07 HISTORY — DX: Iron deficiency anemia, unspecified: D50.9

## 2021-10-07 SURGERY — ESOPHAGOGASTRODUODENOSCOPY (EGD) WITH PROPOFOL
Anesthesia: General

## 2021-10-07 MED ORDER — PHENYLEPHRINE HCL (PRESSORS) 10 MG/ML IV SOLN
INTRAVENOUS | Status: AC
  Filled 2021-10-07: qty 1

## 2021-10-07 MED ORDER — PROPOFOL 500 MG/50ML IV EMUL
INTRAVENOUS | Status: DC | PRN
  Administered 2021-10-07: 150 ug/kg/min via INTRAVENOUS

## 2021-10-07 MED ORDER — PROPOFOL 10 MG/ML IV BOLUS
INTRAVENOUS | Status: AC
  Filled 2021-10-07: qty 20

## 2021-10-07 MED ORDER — PROPOFOL 500 MG/50ML IV EMUL
INTRAVENOUS | Status: AC
  Filled 2021-10-07: qty 50

## 2021-10-07 MED ORDER — PHENYLEPHRINE 40 MCG/ML (10ML) SYRINGE FOR IV PUSH (FOR BLOOD PRESSURE SUPPORT)
PREFILLED_SYRINGE | INTRAVENOUS | Status: DC | PRN
  Administered 2021-10-07: 50 ug via INTRAVENOUS

## 2021-10-07 MED ORDER — SODIUM CHLORIDE 0.9 % IV SOLN
INTRAVENOUS | Status: DC
  Administered 2021-10-07: 20 mL/h via INTRAVENOUS

## 2021-10-07 MED ORDER — LIDOCAINE HCL (PF) 2 % IJ SOLN
INTRAMUSCULAR | Status: AC
  Filled 2021-10-07: qty 5

## 2021-10-07 MED ORDER — LIDOCAINE HCL (CARDIAC) PF 100 MG/5ML IV SOSY
PREFILLED_SYRINGE | INTRAVENOUS | Status: DC | PRN
  Administered 2021-10-07: 50 mg via INTRAVENOUS

## 2021-10-07 NOTE — H&P (Signed)
Pre-Procedure H&P   Patient ID: Katrina Rose is a 60 y.o. female.  Gastroenterology Provider: Annamaria Helling, DO  Referring Provider: Laurine Blazer, PA PCP: Derinda Late, MD  Date: 10/07/2021  HPI Katrina Rose is a 60 y.o. female who presents today for Esophagogastroduodenoscopy and Colonoscopy for iron deficiency anemia, diarrhea, gerd, question of fhx of colorectal cancer.  Pt notes intermittent diarrhea, normally triggered by foods, but hasn't identified a specific one. Has ongoing acid reflux with burning chest discomfort- currently not relieved by ppi. Was taking alka seltzer and tums. Not taking ppi 30 min prior to meal. No blood in stool or melena.   Hgb 10.1, mcv 77.5. Does have CKD  CHF 30-35% EF; AICD in place. No oac/anti plt. Denies nsaids  Occasional tobacco use. No etoh.  2012- egd/colonoscopy- dr. Candace Cruise- normal; negative for HP and celiac at that time.  Past Medical History:  Diagnosis Date   AICD (automatic cardioverter/defibrillator) present    CHF (congestive heart failure) (HCC)    GERD (gastroesophageal reflux disease)    Hypertension    IDA (iron deficiency anemia)    Insomnia    LBBB (left bundle branch block)     Past Surgical History:  Procedure Laterality Date   ANKLE SURGERY Left    CARDIAC CATHETERIZATION     COLONOSCOPY     debridement and closure sternal wound     DILATION AND CURETTAGE OF UTERUS     ESOPHAGOGASTRODUODENOSCOPY     ICD GENERATOR CHANGEOUT N/A 08/01/2021   Procedure: ICD GENERATOR CHANGEOUT;  Surgeon: Isaias Cowman, MD;  Location: [facility];  Service: Cardiovascular;  Laterality: N/A;   pacermaker/ defibrilater   2010   TUBAL LIGATION      Family History Question of possible crc in father- pt unsure No h/o GI disease or malignancy  Review of Systems  Constitutional:  Negative for activity change, appetite change, chills, diaphoresis, fatigue, fever and unexpected weight change.   HENT:  Negative for trouble swallowing and voice change.   Respiratory:  Negative for shortness of breath and wheezing.   Cardiovascular:  Negative for chest pain, palpitations and leg swelling.  Gastrointestinal:  Positive for diarrhea (resolved). Negative for abdominal distention, abdominal pain, anal bleeding, blood in stool, constipation, nausea, rectal pain and vomiting.  Musculoskeletal:  Negative for arthralgias and myalgias.  Skin:  Negative for color change and pallor.  Neurological:  Negative for dizziness, syncope and weakness.  Psychiatric/Behavioral:  Negative for confusion.   All other systems reviewed and are negative.   Medications No current facility-administered medications on file prior to encounter.   Current Outpatient Medications on File Prior to Encounter  Medication Sig Dispense Refill   aspirin EC 81 MG tablet Take 81 mg by mouth daily.     atorvastatin (LIPITOR) 20 MG tablet Take 20 mg by mouth daily.     buPROPion (WELLBUTRIN XL) 150 MG 24 hr tablet Take 150 mg by mouth daily.     carvedilol (COREG) 12.5 MG tablet Take 12.5 mg by mouth 2 (two) times daily with a meal.     cephALEXin (KEFLEX) 250 MG capsule Take 2 capsules (500 mg total) by mouth 2 (two) times daily. 14 capsule 0   furosemide (LASIX) 20 MG tablet Take 20 mg by mouth daily.     lisinopril (ZESTRIL) 5 MG tablet Take 5 mg by mouth in the morning and at bedtime.     pantoprazole (PROTONIX) 40 MG tablet Take 40  mg by mouth daily.     Probiotic Product (PROBIOTIC DAILY PO) Take 1 capsule by mouth daily.     spironolactone (ALDACTONE) 25 MG tablet Take 25 mg by mouth daily.      Pertinent medications related to GI and procedure were reviewed by me with the patient prior to the procedure   Current Facility-Administered Medications:    0.9 %  sodium chloride infusion, , Intravenous, Continuous, Annamaria Helling, DO, Last Rate: 20 mL/hr at 10/07/21 0842, 20 mL/hr at 10/07/21 0842  sodium  chloride 20 mL/hr (10/07/21 0842)       Allergies  Allergen Reactions   Sulfa Antibiotics Hives   Allergies were reviewed by me prior to the procedure  Objective    Vitals:   10/07/21 0828  BP: (!) 108/93  Pulse: (!) 111  Resp: 20  Temp: (!) 96 F (35.6 C)  TempSrc: Temporal  SpO2: 100%  Weight: 64 kg  Height: 5\' 1"  (1.549 m)     Physical Exam Vitals and nursing note reviewed.  Constitutional:      General: She is not in acute distress.    Appearance: Normal appearance. She is not ill-appearing, toxic-appearing or diaphoretic.  HENT:     Head: Normocephalic and atraumatic.     Nose: Nose normal.     Mouth/Throat:     Mouth: Mucous membranes are moist.     Pharynx: Oropharynx is clear.  Eyes:     General: No scleral icterus.    Extraocular Movements: Extraocular movements intact.  Cardiovascular:     Rate and Rhythm: Regular rhythm. Tachycardia present.  Pulmonary:     Effort: Pulmonary effort is normal. No respiratory distress.     Breath sounds: Normal breath sounds. No wheezing, rhonchi or rales.  Abdominal:     General: Bowel sounds are normal. There is no distension.     Palpations: Abdomen is soft.     Tenderness: There is no abdominal tenderness. There is no guarding or rebound.  Musculoskeletal:     Cervical back: Neck supple.     Right lower leg: No edema.     Left lower leg: No edema.  Skin:    General: Skin is warm and dry.     Coloration: Skin is not jaundiced or pale.  Neurological:     General: No focal deficit present.     Mental Status: She is alert and oriented to person, place, and time. Mental status is at baseline.  Psychiatric:        Mood and Affect: Mood normal.        Behavior: Behavior normal.        Thought Content: Thought content normal.        Judgment: Judgment normal.     Assessment:  Katrina Rose is a 60 y.o. female  who presents today for Colonoscopy for iron deficiency anemia, diarrhea, gerd, question of fhx  of colorectal cancer.  Plan:  Esophagogastroduodenoscopy and Colonoscopy with possible intervention today  Esophagogastroduodenoscopy and Colonoscopy with possible biopsy, control of bleeding, polypectomy, and interventions as necessary has been discussed with the patient/patient representative. Informed consent was obtained from the patient/patient representative after explaining the indication, nature, and risks of the procedure including but not limited to death, bleeding, perforation, missed neoplasm/lesions, cardiorespiratory compromise, and reaction to medications. Opportunity for questions was given and appropriate answers were provided. Patient/patient representative has verbalized understanding is amenable to undergoing the procedure.   Annamaria Helling, DO  [facility]  Portions of the record may have been created with voice recognition software. Occasional wrong-word or 'sound-a-like' substitutions may have occurred due to the inherent limitations of voice recognition software.  Read the chart carefully and recognize, using context, where substitutions may have occurred.

## 2021-10-07 NOTE — Op Note (Signed)
[facility] Gastroenterology Patient Name: Katrina Rose Procedure Date: 10/07/2021 9:19 AM MRN: [id number] Account #: [id number] Date of Birth: [birth year] Admit Type: Outpatient Age: 60 Room: [facility] ENDO ROOM 2 Gender: Female Note Status: Finalized Instrument Name: Upper Endoscope [id number] Procedure:             Upper GI endoscopy Indications:           Suspected esophageal reflux Providers:             Annamaria Helling DO, DO Referring MD:          Caprice Renshaw MD (Referring MD) Medicines:             Monitored Anesthesia Care Complications:         No immediate complications. Estimated blood loss:                         Minimal. Procedure:             Pre-Anesthesia Assessment:                        - Prior to the procedure, a History and Physical was                         performed, and patient medications and allergies were                         reviewed. The patient is competent. The risks and                         benefits of the procedure and the sedation options and                         risks were discussed with the patient. All questions                         were answered and informed consent was obtained.                         Patient identification and proposed procedure were                         verified by the physician, the nurse, the anesthetist                         and the technician in the endoscopy suite. Mental                         Status Examination: alert and oriented. Airway                         Examination: normal oropharyngeal airway and neck                         mobility. Respiratory Examination: clear to                         auscultation. CV Examination: normal. Prophylactic  Antibiotics: The patient does not require prophylactic                         antibiotics. Prior Anticoagulants: The patient has                         taken no previous anticoagulant or antiplatelet                          agents. ASA Grade Assessment: III - A patient with                         severe systemic disease. After reviewing the risks and                         benefits, the patient was deemed in satisfactory                         condition to undergo the procedure. The anesthesia                         plan was to use monitored anesthesia care (MAC).                         Immediately prior to administration of medications,                         the patient was re-assessed for adequacy to receive                         sedatives. The heart rate, respiratory rate, oxygen                         saturations, blood pressure, adequacy of pulmonary                         ventilation, and response to care were monitored                         throughout the procedure. The physical status of the                         patient was re-assessed after the procedure.                        After obtaining informed consent, the endoscope was                         passed under direct vision. Throughout the procedure,                         the patient's blood pressure, pulse, and oxygen                         saturations were monitored continuously. The Endoscope                         was introduced through the mouth, and advanced to the  second part of duodenum. The upper GI endoscopy was                         accomplished without difficulty. The patient tolerated                         the procedure well. Findings:      The duodenal bulb, first portion of the duodenum and second portion of       the duodenum were normal. Biopsies for histology were taken with a cold       forceps for evaluation of celiac disease. Estimated blood loss was       minimal.      Normal mucosa was found in the entire examined stomach. Biopsies were       taken with a cold forceps for Helicobacter pylori testing. Estimated       blood loss was minimal.      A 3  cm hiatal hernia was present. Estimated blood loss: none.      The Z-line was regular. Estimated blood loss: none.      Esophagogastric landmarks were identified: the gastroesophageal junction       was found at 36 cm from the incisors.      A single diminutive mucosal nodule was found at the gastroesophageal       junction. Biopsies were taken with a cold forceps for histology.       Estimated blood loss was minimal.      Normal mucosa was found in the entire esophagus. Estimated blood loss:       none.      The exam was otherwise without abnormality. Impression:            - Normal duodenal bulb, first portion of the duodenum                         and second portion of the duodenum. Biopsied.                        - Normal mucosa was found in the entire stomach.                         Biopsied.                        - 3 cm hiatal hernia.                        - Z-line regular.                        - Esophagogastric landmarks identified.                        - Mucosal nodule found in the esophagus. Biopsied.                        - Normal mucosa was found in the entire esophagus.                        - The examination was otherwise normal. Recommendation:        - Discharge patient to home.                        -  Resume previous diet.                        - Continue present medications.                        - Continue proton pump inhibitor                         (protonix/pantoprazole)- take 30 minutes prior to                         supper                        - Await pathology results.                        - Return to referring physician as previously                         scheduled. Procedure Code(s):     --- Professional ---                        [phone], Esophagogastroduodenoscopy, flexible,                         transoral; with biopsy, single or multiple Diagnosis Code(s):     --- Professional ---                        K44.9, Diaphragmatic hernia  without obstruction or                         gangrene                        K22.8, Other specified diseases of esophagus CPT copyright 2019 American Medical Association. All rights reserved. The codes documented in this report are preliminary and upon coder review may  be revised to meet current compliance requirements. Attending Participation:      I personally performed the entire procedure. Volney American, DO Annamaria Helling DO, DO 10/07/2021 10:06:32 AM This report has been signed electronically. Number of Addenda: 0 Note Initiated On: 10/07/2021 9:19 AM Estimated Blood Loss:  Estimated blood loss was minimal.      [facility]

## 2021-10-07 NOTE — Interval H&P Note (Signed)
History and Physical Interval Note: Preprocedure H&P from 10/07/21  was reviewed and there was no interval change after seeing and examining the patient.  Written consent was obtained from the patient after discussion of risks, benefits, and alternatives. Patient has consented to proceed with Esophagogastroduodenoscopy and Colonoscopy with possible intervention   10/07/2021 9:20 AM  Katrina Rose  has presented today for surgery, with the diagnosis of Change in bowel habits R19.4 Gastroesophageal reflux disease, unspecified whether esophagitis present K21.9 Iron deficiency anemia, unspecified iron deficiency anemia type D50.9.  The various methods of treatment have been discussed with the patient and family. After consideration of risks, benefits and other options for treatment, the patient has consented to  Procedure(s): ESOPHAGOGASTRODUODENOSCOPY (EGD) WITH PROPOFOL (N/A) COLONOSCOPY WITH PROPOFOL (N/A) as a surgical intervention.  The patient's history has been reviewed, patient examined, no change in status, stable for surgery.  I have reviewed the patient's chart and labs.  Questions were answered to the patient's satisfaction.     Annamaria Helling

## 2021-10-07 NOTE — Op Note (Signed)
[facility] Gastroenterology Patient Name: Katrina Rose Procedure Date: 10/07/2021 9:19 AM MRN: [id number] Account #: [id number] Date of Birth: 1961/08/22 Admit Type: Outpatient Age: 60 Room: [facility] ENDO ROOM 2 Gender: Female Note Status: Finalized Instrument Name: Colonoscope [id number] Procedure:             Colonoscopy Indications:           Chronic diarrhea, Iron deficiency anemia Providers:             Rueben Bash, DO Referring MD:          Caprice Renshaw MD (Referring MD) Medicines:             Monitored Anesthesia Care Complications:         No immediate complications. Estimated blood loss:                         Minimal. Procedure:             Pre-Anesthesia Assessment:                        - Prior to the procedure, a History and Physical was                         performed, and patient medications and allergies were                         reviewed. The patient is competent. The risks and                         benefits of the procedure and the sedation options and                         risks were discussed with the patient. All questions                         were answered and informed consent was obtained.                         Patient identification and proposed procedure were                         verified by the physician, the nurse, the anesthetist                         and the technician in the endoscopy suite. Mental                         Status Examination: alert and oriented. Airway                         Examination: normal oropharyngeal airway and neck                         mobility. Respiratory Examination: clear to                         auscultation. CV Examination: normal. Prophylactic  Antibiotics: The patient does not require prophylactic                         antibiotics. Prior Anticoagulants: The patient has                         taken no previous anticoagulant or  antiplatelet                         agents. ASA Grade Assessment: III - A patient with                         severe systemic disease. After reviewing the risks and                         benefits, the patient was deemed in satisfactory                         condition to undergo the procedure. The anesthesia                         plan was to use monitored anesthesia care (MAC).                         Immediately prior to administration of medications,                         the patient was re-assessed for adequacy to receive                         sedatives. The heart rate, respiratory rate, oxygen                         saturations, blood pressure, adequacy of pulmonary                         ventilation, and response to care were monitored                         throughout the procedure. The physical status of the                         patient was re-assessed after the procedure.                        After obtaining informed consent, the colonoscope was                         passed under direct vision. Throughout the procedure,                         the patient's blood pressure, pulse, and oxygen                         saturations were monitored continuously. The                         Colonoscope was introduced through the anus and  advanced to the the terminal ileum, with                         identification of the appendiceal orifice and IC                         valve. The colonoscopy was performed without                         difficulty. The patient tolerated the procedure well.                         The quality of the bowel preparation was evaluated                         using the BBPS [facility] Bowel Preparation Scale) with                         scores of: Right Colon = 3, Transverse Colon = 3 and                         Left Colon = 3 (entire mucosa seen well with no                         residual staining, small fragments  of stool or opaque                         liquid). The total BBPS score equals 9. The terminal                         ileum, ileocecal valve, appendiceal orifice, and                         rectum were photographed. Findings:      The perianal and digital rectal examinations were normal. Pertinent       negatives include normal sphincter tone.      The terminal ileum appeared normal.      Non-bleeding internal hemorrhoids were found during retroflexion. The       hemorrhoids were Grade I (internal hemorrhoids that do not prolapse).      A few small-mouthed diverticula were found in the left colon. Estimated       blood loss: none.      A 2 to 3 mm polyp was found in the descending colon. The polyp was       sessile. The polyp was removed with a cold biopsy forceps. Resection and       retrieval were complete. Estimated blood loss was minimal.      A 5 to 6 mm polyp was found in the rectum. The polyp was sessile. The       polyp was removed with a cold snare. Resection and retrieval were       complete. Estimated blood loss was minimal.      The exam was otherwise without abnormality on direct and retroflexion       views. Impression:            - The examined portion of the ileum was normal.                        -  Non-bleeding internal hemorrhoids.                        - Diverticulosis in the left colon.                        - One 2 to 3 mm polyp in the descending colon, removed                         with a cold biopsy forceps. Resected and retrieved.                        - One 5 to 6 mm polyp in the rectum, removed with a                         cold snare. Resected and retrieved.                        - The examination was otherwise normal on direct and                         retroflexion views. Recommendation:        - Discharge patient to home.                        - Resume previous diet.                        - Continue present medications.                         - No aspirin, ibuprofen, naproxen, or other                         non-steroidal anti-inflammatory drugs for 5 days after                         polyp removal.                        - Await pathology results.                        - Repeat colonoscopy for surveillance based on                         pathology results.                        - Return to referring physician as previously                         scheduled. Procedure Code(s):     --- Professional ---                        [phone], Colonoscopy, flexible; with removal of                         tumor(s), polyp(s), or other lesion(s) by snare  technique                        [id number], 59, Colonoscopy, flexible; with biopsy, single                         or multiple Diagnosis Code(s):     --- Professional ---                        K64.0, First degree hemorrhoids                        K63.5, Polyp of colon                        K62.1, Rectal polyp                        K52.9, Noninfective gastroenteritis and colitis,                         unspecified                        D50.9, Iron deficiency anemia, unspecified                        K57.30, Diverticulosis of large intestine without                         perforation or abscess without bleeding CPT copyright 2019 American Medical Association. All rights reserved. The codes documented in this report are preliminary and upon coder review may  be revised to meet current compliance requirements. Attending Participation:      I personally performed the entire procedure. Volney American, DO Annamaria Helling DO, DO 10/07/2021 10:10:36 AM This report has been signed electronically. Number of Addenda: 0 Note Initiated On: 10/07/2021 9:19 AM Scope Withdrawal Time: 0 hours 12 minutes 9 seconds  Total Procedure Duration: 0 hours 19 minutes 20 seconds  Estimated Blood Loss:  Estimated blood loss was minimal.      [facility]

## 2021-10-07 NOTE — Anesthesia Preprocedure Evaluation (Signed)
Anesthesia Evaluation  Patient identified by MRN, date of birth, ID band Patient awake    Reviewed: Allergy & Precautions, H&P , NPO status , Patient's Chart, lab work & pertinent test results, reviewed documented beta blocker date and time   Airway Mallampati: II   Neck ROM: full    Dental  (+) Upper Dentures, Lower Dentures   Pulmonary neg pulmonary ROS, Current Smoker,    Pulmonary exam normal        Cardiovascular Exercise Tolerance: Poor hypertension, On Medications +CHF  Normal cardiovascular exam+ dysrhythmias + Cardiac Defibrillator  Rhythm:regular Rate:Normal     Neuro/Psych negative neurological ROS  negative psych ROS   GI/Hepatic Neg liver ROS, GERD  Medicated,  Endo/Other  negative endocrine ROS  Renal/GU negative Renal ROS  negative genitourinary   Musculoskeletal   Abdominal   Peds  Hematology  (+) Blood dyscrasia, anemia ,   Anesthesia Other Findings Past Medical History: No date: AICD (automatic cardioverter/defibrillator) present No date: CHF (congestive heart failure) (HCC) No date: GERD (gastroesophageal reflux disease) No date: Hypertension No date: IDA (iron deficiency anemia) No date: Insomnia No date: LBBB (left bundle branch block) Past Surgical History: No date: ANKLE SURGERY; Left No date: CARDIAC CATHETERIZATION No date: COLONOSCOPY No date: debridement and closure sternal wound No date: DILATION AND CURETTAGE OF UTERUS No date: ESOPHAGOGASTRODUODENOSCOPY 08/01/2021: ICD GENERATOR CHANGEOUT; N/A     Comment:  Procedure: ICD GENERATOR CHANGEOUT;  Surgeon: Isaias Cowman, MD;  Location: [facility];  Service:              Cardiovascular;  Laterality: N/A; 2010: pacermaker/ defibrilater  No date: TUBAL LIGATION BMI    Body Mass Index: 26.64 kg/m     Reproductive/Obstetrics negative OB ROS                              Anesthesia Physical Anesthesia Plan  ASA: 3  Anesthesia Plan: General   Post-op Pain Management:    Induction:   PONV Risk Score and Plan:   Airway Management Planned:   Additional Equipment:   Intra-op Plan:   Post-operative Plan:   Informed Consent: I have reviewed the patients History and Physical, chart, labs and discussed the procedure including the risks, benefits and alternatives for the proposed anesthesia with the patient or authorized representative who has indicated his/her understanding and acceptance.     Dental Advisory Given  Plan Discussed with: CRNA  Anesthesia Plan Comments:         Anesthesia Quick Evaluation

## 2021-10-07 NOTE — Transfer of Care (Signed)
Immediate Anesthesia Transfer of Care Note  Patient: Katrina Rose  Procedure(s) Performed: ESOPHAGOGASTRODUODENOSCOPY (EGD) WITH PROPOFOL COLONOSCOPY WITH PROPOFOL  Patient Location: PACU  Anesthesia Type:General  Level of Consciousness: awake and oriented  Airway & Oxygen Therapy: Patient Spontanous Breathing and Patient connected to nasal cannula oxygen  Post-op Assessment: Report given to RN and Post -op Vital signs reviewed and stable  Post vital signs: Reviewed and stable  Last Vitals:  Vitals Value Taken Time  BP    Temp    Pulse    Resp    SpO2      Last Pain:  Vitals:   10/07/21 0828  TempSrc: Temporal  PainSc: 0-No pain         Complications: No notable events documented.

## 2021-10-07 NOTE — Anesthesia Postprocedure Evaluation (Signed)
Anesthesia Post Note  Patient: Katrina Rose  Procedure(s) Performed: ESOPHAGOGASTRODUODENOSCOPY (EGD) WITH PROPOFOL COLONOSCOPY WITH PROPOFOL  Patient location during evaluation: PACU Anesthesia Type: General Level of consciousness: awake and alert Pain management: pain level controlled Vital Signs Assessment: post-procedure vital signs reviewed and stable Respiratory status: spontaneous breathing, nonlabored ventilation, respiratory function stable and patient connected to nasal cannula oxygen Cardiovascular status: blood pressure returned to baseline and stable Postop Assessment: no apparent nausea or vomiting Anesthetic complications: no   No notable events documented.   Last Vitals:  Vitals:   10/07/21 0828 10/07/21 1003  BP: (!) 108/93 (!) 99/53  Pulse: (!) 111 93  Resp: 20 14  Temp: (!) 35.6 C   SpO2: 100% 100%    Last Pain:  Vitals:   10/07/21 1003  TempSrc:   PainSc: [place] Akbar Sacra

## 2021-10-07 NOTE — Anesthesia Procedure Notes (Signed)
Date/Time: 10/07/2021 9:29 AM Performed by: Vaughan Sine Pre-anesthesia Checklist: Patient identified, Emergency Drugs available, Suction available, Patient being monitored and Timeout performed Patient Re-evaluated:Patient Re-evaluated prior to induction Oxygen Delivery Method: Nasal cannula Preoxygenation: Pre-oxygenation with 100% oxygen Induction Type: IV induction Airway Equipment and Method: Bite block Placement Confirmation: positive ETCO2 and CO2 detector

## 2021-10-08 ENCOUNTER — Encounter: Payer: Self-pay | Admitting: Gastroenterology

## 2021-10-08 DIAGNOSIS — Z20822 Contact with and (suspected) exposure to covid-19: Secondary | ICD-10-CM | POA: Diagnosis not present

## 2021-10-08 LAB — SURGICAL PATHOLOGY

## 2021-10-15 DIAGNOSIS — Z20822 Contact with and (suspected) exposure to covid-19: Secondary | ICD-10-CM | POA: Diagnosis not present

## 2021-10-15 DIAGNOSIS — I420 Dilated cardiomyopathy: Secondary | ICD-10-CM | POA: Diagnosis not present

## 2021-10-22 DIAGNOSIS — Z20822 Contact with and (suspected) exposure to covid-19: Secondary | ICD-10-CM | POA: Diagnosis not present

## 2021-10-29 DIAGNOSIS — Z20822 Contact with and (suspected) exposure to covid-19: Secondary | ICD-10-CM | POA: Diagnosis not present

## 2021-11-06 DIAGNOSIS — Z20822 Contact with and (suspected) exposure to covid-19: Secondary | ICD-10-CM | POA: Diagnosis not present

## 2021-11-07 ENCOUNTER — Emergency Department
Admission: EM | Admit: 2021-11-07 | Discharge: 2021-11-07 | Disposition: A | Discharge status: Home / Self Care | Payer: Medicare HMO | Attending: Emergency Medicine | Admitting: Emergency Medicine

## 2021-11-07 ENCOUNTER — Emergency Department: Payer: Medicare HMO

## 2021-11-07 ENCOUNTER — Other Ambulatory Visit: Payer: Self-pay

## 2021-11-07 DIAGNOSIS — R413 Other amnesia: Secondary | ICD-10-CM | POA: Diagnosis not present

## 2021-11-07 DIAGNOSIS — F0390 Unspecified dementia without behavioral disturbance: Secondary | ICD-10-CM | POA: Diagnosis not present

## 2021-11-07 DIAGNOSIS — S0990XA Unspecified injury of head, initial encounter: Secondary | ICD-10-CM | POA: Diagnosis not present

## 2021-11-07 DIAGNOSIS — R443 Hallucinations, unspecified: Secondary | ICD-10-CM | POA: Insufficient documentation

## 2021-11-07 LAB — CBC WITH DIFFERENTIAL/PLATELET
Abs Immature Granulocytes: 0.02 10*3/uL (ref 0.00–0.07)
Basophils Absolute: 0 10*3/uL (ref 0.0–0.1)
Basophils Relative: 1 %
Eosinophils Absolute: 0.2 10*3/uL (ref 0.0–0.5)
Eosinophils Relative: 3 %
HCT: 34.9 % — ABNORMAL LOW (ref 36.0–46.0)
Hemoglobin: 10.8 g/dL — ABNORMAL LOW (ref 12.0–15.0)
Immature Granulocytes: 0 %
Lymphocytes Relative: 29 %
Lymphs Abs: 1.7 10*3/uL (ref 0.7–4.0)
MCH: 23.4 pg — ABNORMAL LOW (ref 26.0–34.0)
MCHC: 30.9 g/dL (ref 30.0–36.0)
MCV: 75.7 fL — ABNORMAL LOW (ref 80.0–100.0)
Monocytes Absolute: 0.4 10*3/uL (ref 0.1–1.0)
Monocytes Relative: 7 %
Neutro Abs: 3.4 10*3/uL (ref 1.7–7.7)
Neutrophils Relative %: 60 %
Platelets: 366 10*3/uL (ref 150–400)
RBC: 4.61 MIL/uL (ref 3.87–5.11)
RDW: 16.1 % — ABNORMAL HIGH (ref 11.5–15.5)
WBC: 5.7 10*3/uL (ref 4.0–10.5)
nRBC: 0 % (ref 0.0–0.2)

## 2021-11-07 LAB — COMPREHENSIVE METABOLIC PANEL
ALT: 20 U/L (ref 0–44)
AST: 23 U/L (ref 15–41)
Albumin: 5.1 g/dL — ABNORMAL HIGH (ref 3.5–5.0)
Alkaline Phosphatase: 119 U/L (ref 38–126)
Anion gap: 12 (ref 5–15)
BUN: 21 mg/dL — ABNORMAL HIGH (ref 6–20)
CO2: 23 mmol/L (ref 22–32)
Calcium: 9.9 mg/dL (ref 8.9–10.3)
Chloride: 102 mmol/L (ref 98–111)
Creatinine, Ser: 1.05 mg/dL — ABNORMAL HIGH (ref 0.44–1.00)
GFR, Estimated: >60 mL/min (ref >60)
Glucose, Bld: 101 mg/dL — ABNORMAL HIGH (ref 70–99)
Potassium: 4.3 mmol/L (ref 3.5–5.1)
Sodium: 137 mmol/L (ref 135–145)
Total Bilirubin: 0.9 mg/dL (ref 0.3–1.2)
Total Protein: 8.7 g/dL — ABNORMAL HIGH (ref 6.5–8.1)

## 2021-11-07 LAB — URINALYSIS, ROUTINE W REFLEX MICROSCOPIC
Bilirubin Urine: NEGATIVE
Glucose, UA: NEGATIVE mg/dL
Hgb urine dipstick: NEGATIVE
Ketones, ur: NEGATIVE mg/dL
Leukocytes,Ua: NEGATIVE
Nitrite: NEGATIVE
Protein, ur: NEGATIVE mg/dL
Specific Gravity, Urine: 1.012 (ref 1.005–1.030)
pH: 5 (ref 5.0–8.0)

## 2021-11-07 LAB — URINE DRUG SCREEN, QUALITATIVE (ARMC ONLY)
Amphetamines, Ur Screen: NOT DETECTED
Barbiturates, Ur Screen: NOT DETECTED
Benzodiazepine, Ur Scrn: NOT DETECTED
Cannabinoid 50 Ng, Ur ~~LOC~~: NOT DETECTED
Cocaine Metabolite,Ur ~~LOC~~: NOT DETECTED
MDMA (Ecstasy)Ur Screen: NOT DETECTED
Methadone Scn, Ur: NOT DETECTED
Opiate, Ur Screen: NOT DETECTED
Phencyclidine (PCP) Ur S: NOT DETECTED
Tricyclic, Ur Screen: NOT DETECTED

## 2021-11-07 NOTE — ED Provider Triage Note (Signed)
Emergency Medicine Provider Triage Evaluation Note  Katrina Rose , a 61 y.o. female  was evaluated in triage.  Pt states that 2 1/2 weeks ago hit her head on the freezer door. No LOC known, not witnessed patient was lives alone.  Patient states that she "sat down on the floor".  Since that time she has been hallucinating seeing mostly her granddaughter in the house.  She states that her daughter came by yesterday to check on her and told her that the granddaughter was not in the house.  She contacted her PCP who told her to come to the emergency department.   Review of Systems  Positive: Positive hallucinations. Negative: No nausea, vomiting or vision changes  Physical Exam  There were no vitals taken for this visit. Gen:   Awake, no distress talkative and answers questions in triage.  No slurred speech noted. Resp:  Normal effort lungs are clear bilaterally. MSK:   Moves extremities without difficulty  Other:  Cranial nerves grossly intact.  No facial droop, PERRLA, EOMI Medical Decision Making  Medically screening exam initiated at 10:33 AM.  Appropriate orders placed.  Katrina Rose was informed that the remainder of the evaluation will be completed by another provider, this initial triage assessment does not replace that evaluation, and the importance of remaining in the ED until their evaluation is complete.     Johnn Hai, PA-C 11/07/21 1048

## 2021-11-07 NOTE — ED Triage Notes (Signed)
Pt reports hitting head on freezer door 2.5 weeks ago. Denies LOC. No blood thinners  States started hallucinating yesterday, thought she was babysitting granddaughter when her daughter informed her that granddaughter was not there.

## 2021-11-12 ENCOUNTER — Other Ambulatory Visit: Payer: Self-pay | Admitting: Family Medicine

## 2021-11-12 DIAGNOSIS — Z1231 Encounter for screening mammogram for malignant neoplasm of breast: Secondary | ICD-10-CM

## 2021-11-13 DIAGNOSIS — E7800 Pure hypercholesterolemia, unspecified: Secondary | ICD-10-CM | POA: Diagnosis not present

## 2021-11-13 DIAGNOSIS — R739 Hyperglycemia, unspecified: Secondary | ICD-10-CM | POA: Diagnosis not present

## 2021-11-13 DIAGNOSIS — Z79899 Other long term (current) drug therapy: Secondary | ICD-10-CM | POA: Diagnosis not present

## 2021-11-13 DIAGNOSIS — Z20822 Contact with and (suspected) exposure to covid-19: Secondary | ICD-10-CM | POA: Diagnosis not present

## 2021-11-13 NOTE — ED Provider Notes (Signed)
[facility] Provider Note    Event Date/Time   First MD Initiated Contact with Patient 11/07/21 1253     (approximate)   History   Fall   HPI  Katrina Rose is a 61 y.o. female who is here for evaluation for reports of intermittent hallucinations, memory loss.  She reports she had a mild head injury recently which her daughter wanted her evaluated for but notes that her memory loss has been ongoing for quite some time.  Spoke with her daughter on the phone who notes that it has been nearly a year now that memory has been declining.  No acute neurodeficits     Physical Exam   Triage Vital Signs: ED Triage Vitals  Enc Vitals Group     BP 11/07/21 1034 (!) 138/106     Pulse Rate 11/07/21 1034 (!) 110     Resp 11/07/21 1034 18     Temp 11/07/21 1034 98.9 F (37.2 C)     Temp Source 11/07/21 1034 Oral     SpO2 11/07/21 1034 100 %     Weight 11/07/21 1035 64 kg (141 lb 1.5 oz)     Height 11/07/21 1035 1.549 m (5\' 1" )     Head Circumference --      Peak Flow --      Pain Score 11/07/21 1034 5     Pain Loc --      Pain Edu? --      Excl. in [place]? --     Most recent vital signs: Vitals:   11/07/21 1034  BP: (!) 138/106  Pulse: (!) 110  Resp: 18  Temp: 98.9 F (37.2 C)  SpO2: 100%     General: Awake, no distress.  CV:  Good peripheral perfusion.  Resp:  Normal effort.  Abd:  No distention.  Other:  Neuro exam is normal, normal cranial nerves, normal strength in all extremities, ambulating well.   ED Results / Procedures / Treatments   Labs (all labs ordered are listed, but only abnormal results are displayed) Labs Reviewed  CBC WITH DIFFERENTIAL/PLATELET - Abnormal; Notable for the following components:      Result Value   Hemoglobin 10.8 (*)    HCT 34.9 (*)    MCV 75.7 (*)    MCH 23.4 (*)    RDW 16.1 (*)    All other components within normal limits  COMPREHENSIVE METABOLIC PANEL - Abnormal; Notable for the following  components:   Glucose, Bld 101 (*)    BUN 21 (*)    Creatinine, Ser 1.05 (*)    Total Protein 8.7 (*)    Albumin 5.1 (*)    All other components within normal limits  URINALYSIS, ROUTINE W REFLEX MICROSCOPIC - Abnormal; Notable for the following components:   Color, Urine YELLOW (*)    APPearance CLEAR (*)    All other components within normal limits  URINE DRUG SCREEN, QUALITATIVE ([facility] ONLY)     EKG     RADIOLOGY CT head reviewed by me, no acute abnormality    PROCEDURES:  Critical Care performed:   Procedures   MEDICATIONS ORDERED IN ED: Medications - No data to display   IMPRESSION / MDM / [place] / ED COURSE  I reviewed the triage vital signs and the nursing notes.  Had a long discussion with patient and patient's daughter I do suspect her symptoms are related to gradual progression of dementia.  Daughter is also concerned  about the same thing.  CT imaging today is reassuring  Lab work reviewed today includes normal CMP lab work is overall reassuring, mild elevation of BUN/creatinine on CMP.  Normal white blood cell count on CBC.  Urinalysis is unremarkable  Discussed with patient that outpatient follow-up with neurology would be the best option for further evaluation          FINAL CLINICAL IMPRESSION(S) / ED DIAGNOSES   Final diagnoses:  Dementia, unspecified dementia severity, unspecified dementia type, unspecified whether behavioral, psychotic, or mood disturbance or anxiety ([place])     Rx / DC Orders   ED Discharge Orders     None        Note:  This document was prepared using Dragon voice recognition software and may include unintentional dictation errors.   Lavonia Drafts, MD 11/13/21 1048

## 2021-11-19 DIAGNOSIS — Z0000 Encounter for general adult medical examination without abnormal findings: Secondary | ICD-10-CM | POA: Diagnosis not present

## 2021-11-19 DIAGNOSIS — Z79899 Other long term (current) drug therapy: Secondary | ICD-10-CM | POA: Diagnosis not present

## 2021-11-19 DIAGNOSIS — Z1331 Encounter for screening for depression: Secondary | ICD-10-CM | POA: Diagnosis not present

## 2021-11-20 DIAGNOSIS — Z20822 Contact with and (suspected) exposure to covid-19: Secondary | ICD-10-CM | POA: Diagnosis not present

## 2021-11-27 DIAGNOSIS — Z20822 Contact with and (suspected) exposure to covid-19: Secondary | ICD-10-CM | POA: Diagnosis not present

## 2021-12-10 DIAGNOSIS — Z20822 Contact with and (suspected) exposure to covid-19: Secondary | ICD-10-CM | POA: Diagnosis not present

## 2021-12-17 ENCOUNTER — Other Ambulatory Visit: Payer: Self-pay

## 2021-12-17 ENCOUNTER — Ambulatory Visit
Admission: RE | Admit: 2021-12-17 | Discharge: 2021-12-17 | Disposition: A | Discharge status: Home / Self Care | Payer: Medicare HMO | Source: Ambulatory Visit | Attending: Family Medicine | Admitting: Family Medicine

## 2021-12-17 DIAGNOSIS — Z20822 Contact with and (suspected) exposure to covid-19: Secondary | ICD-10-CM | POA: Diagnosis not present

## 2021-12-17 DIAGNOSIS — Z1231 Encounter for screening mammogram for malignant neoplasm of breast: Secondary | ICD-10-CM | POA: Diagnosis not present

## 2021-12-19 DIAGNOSIS — E559 Vitamin D deficiency, unspecified: Secondary | ICD-10-CM | POA: Diagnosis not present

## 2021-12-19 DIAGNOSIS — N189 Chronic kidney disease, unspecified: Secondary | ICD-10-CM | POA: Diagnosis not present

## 2021-12-19 DIAGNOSIS — R413 Other amnesia: Secondary | ICD-10-CM | POA: Diagnosis not present

## 2021-12-19 DIAGNOSIS — Z113 Encounter for screening for infections with a predominantly sexual mode of transmission: Secondary | ICD-10-CM | POA: Diagnosis not present

## 2021-12-19 DIAGNOSIS — Z95810 Presence of automatic (implantable) cardiac defibrillator: Secondary | ICD-10-CM | POA: Diagnosis not present

## 2021-12-23 ENCOUNTER — Other Ambulatory Visit: Payer: Self-pay | Admitting: Neurology

## 2021-12-23 DIAGNOSIS — R413 Other amnesia: Secondary | ICD-10-CM

## 2021-12-24 DIAGNOSIS — Z20822 Contact with and (suspected) exposure to covid-19: Secondary | ICD-10-CM | POA: Diagnosis not present

## 2021-12-31 DIAGNOSIS — Z20822 Contact with and (suspected) exposure to covid-19: Secondary | ICD-10-CM | POA: Diagnosis not present

## 2022-01-06 DIAGNOSIS — H524 Presbyopia: Secondary | ICD-10-CM | POA: Diagnosis not present

## 2022-01-08 DIAGNOSIS — D509 Iron deficiency anemia, unspecified: Secondary | ICD-10-CM | POA: Diagnosis not present

## 2022-01-09 ENCOUNTER — Other Ambulatory Visit (HOSPITAL_COMMUNITY): Payer: Self-pay | Admitting: Neurology

## 2022-01-09 DIAGNOSIS — R413 Other amnesia: Secondary | ICD-10-CM

## 2022-01-14 DIAGNOSIS — I420 Dilated cardiomyopathy: Secondary | ICD-10-CM | POA: Diagnosis not present

## 2022-01-15 DIAGNOSIS — Z20822 Contact with and (suspected) exposure to covid-19: Secondary | ICD-10-CM | POA: Diagnosis not present

## 2022-01-22 DIAGNOSIS — Z20822 Contact with and (suspected) exposure to covid-19: Secondary | ICD-10-CM | POA: Diagnosis not present

## 2022-01-27 DIAGNOSIS — R413 Other amnesia: Secondary | ICD-10-CM | POA: Diagnosis not present

## 2022-01-29 DIAGNOSIS — Z20822 Contact with and (suspected) exposure to covid-19: Secondary | ICD-10-CM | POA: Diagnosis not present

## 2022-02-04 DIAGNOSIS — N189 Chronic kidney disease, unspecified: Secondary | ICD-10-CM | POA: Diagnosis not present

## 2022-02-04 DIAGNOSIS — Z95810 Presence of automatic (implantable) cardiac defibrillator: Secondary | ICD-10-CM | POA: Diagnosis not present

## 2022-02-04 DIAGNOSIS — Z20822 Contact with and (suspected) exposure to covid-19: Secondary | ICD-10-CM | POA: Diagnosis not present

## 2022-02-04 DIAGNOSIS — R519 Headache, unspecified: Secondary | ICD-10-CM | POA: Diagnosis not present

## 2022-02-04 DIAGNOSIS — R413 Other amnesia: Secondary | ICD-10-CM | POA: Diagnosis not present

## 2022-02-11 DIAGNOSIS — Z20822 Contact with and (suspected) exposure to covid-19: Secondary | ICD-10-CM | POA: Diagnosis not present

## 2022-02-18 DIAGNOSIS — Z20822 Contact with and (suspected) exposure to covid-19: Secondary | ICD-10-CM | POA: Diagnosis not present

## 2022-02-25 DIAGNOSIS — Z20822 Contact with and (suspected) exposure to covid-19: Secondary | ICD-10-CM | POA: Diagnosis not present

## 2022-02-28 ENCOUNTER — Other Ambulatory Visit: Payer: Self-pay | Admitting: Neurology

## 2022-02-28 DIAGNOSIS — G35 Multiple sclerosis: Secondary | ICD-10-CM

## 2022-03-05 ENCOUNTER — Ambulatory Visit (HOSPITAL_COMMUNITY)
Admission: RE | Admit: 2022-03-05 | Discharge: 2022-03-05 | Disposition: A | Discharge status: Home / Self Care | Payer: Medicare Other | Source: Ambulatory Visit | Attending: Neurology | Admitting: Neurology

## 2022-03-05 DIAGNOSIS — R413 Other amnesia: Secondary | ICD-10-CM | POA: Insufficient documentation

## 2022-03-05 NOTE — Progress Notes (Signed)
Informed of MRI for today.  ? ?Device system confirmed to be MRI conditional, with implant date > 6 weeks ago, and no evidence of abandoned or epicardial leads in review of most recent CXR ?Interrogation from today reviewed, pt is currently AS-VP at ~70 bpm ?Change device settings for MRI to DOO at 90 bpm ? ?Tachy-therapies to off if applicable. ? ?Program device back to pre-MRI settings after completion of exam. ? ?Shirley Friar, PA-C  ?03/05/2022 1:04 PM   ?

## 2022-03-05 NOTE — Progress Notes (Signed)
Patient here today at [facility] for MRI for brain wo contrast. Patient has medtronic BIV-ICD and is followed at [facility]. CLE sent. Orders for DOO 90. Will re-program once scan is complete ?

## 2022-04-08 DIAGNOSIS — F02B18 Dementia in other diseases classified elsewhere, moderate, with other behavioral disturbance: Secondary | ICD-10-CM | POA: Diagnosis not present

## 2022-04-08 DIAGNOSIS — Z20822 Contact with and (suspected) exposure to covid-19: Secondary | ICD-10-CM | POA: Diagnosis not present

## 2022-04-08 DIAGNOSIS — G300 Alzheimer's disease with early onset: Secondary | ICD-10-CM | POA: Diagnosis not present

## 2022-05-06 DIAGNOSIS — Z20822 Contact with and (suspected) exposure to covid-19: Secondary | ICD-10-CM | POA: Diagnosis not present

## 2022-05-12 DIAGNOSIS — Z79899 Other long term (current) drug therapy: Secondary | ICD-10-CM | POA: Diagnosis not present

## 2022-05-12 DIAGNOSIS — E7800 Pure hypercholesterolemia, unspecified: Secondary | ICD-10-CM | POA: Diagnosis not present

## 2022-05-12 DIAGNOSIS — E782 Mixed hyperlipidemia: Secondary | ICD-10-CM | POA: Diagnosis not present

## 2022-05-12 DIAGNOSIS — I340 Nonrheumatic mitral (valve) insufficiency: Secondary | ICD-10-CM | POA: Diagnosis not present

## 2022-05-12 DIAGNOSIS — I10 Essential (primary) hypertension: Secondary | ICD-10-CM | POA: Diagnosis not present

## 2022-05-12 DIAGNOSIS — I420 Dilated cardiomyopathy: Secondary | ICD-10-CM | POA: Diagnosis not present

## 2022-05-12 DIAGNOSIS — I5022 Chronic systolic (congestive) heart failure: Secondary | ICD-10-CM | POA: Diagnosis not present

## 2022-05-12 DIAGNOSIS — R739 Hyperglycemia, unspecified: Secondary | ICD-10-CM | POA: Diagnosis not present

## 2022-05-19 DIAGNOSIS — Z79899 Other long term (current) drug therapy: Secondary | ICD-10-CM | POA: Diagnosis not present

## 2022-05-19 DIAGNOSIS — I509 Heart failure, unspecified: Secondary | ICD-10-CM | POA: Diagnosis not present

## 2022-05-19 DIAGNOSIS — E785 Hyperlipidemia, unspecified: Secondary | ICD-10-CM | POA: Diagnosis not present

## 2022-05-19 DIAGNOSIS — F329 Major depressive disorder, single episode, unspecified: Secondary | ICD-10-CM | POA: Diagnosis not present

## 2022-05-19 DIAGNOSIS — R739 Hyperglycemia, unspecified: Secondary | ICD-10-CM | POA: Diagnosis not present

## 2022-05-19 DIAGNOSIS — I130 Hypertensive heart and chronic kidney disease with heart failure and stage 1 through stage 4 chronic kidney disease, or unspecified chronic kidney disease: Secondary | ICD-10-CM | POA: Diagnosis not present

## 2022-05-19 DIAGNOSIS — N179 Acute kidney failure, unspecified: Secondary | ICD-10-CM | POA: Diagnosis not present

## 2022-05-19 DIAGNOSIS — F0390 Unspecified dementia without behavioral disturbance: Secondary | ICD-10-CM | POA: Diagnosis not present

## 2022-05-19 DIAGNOSIS — N189 Chronic kidney disease, unspecified: Secondary | ICD-10-CM | POA: Diagnosis not present

## 2022-06-04 DIAGNOSIS — Z20822 Contact with and (suspected) exposure to covid-19: Secondary | ICD-10-CM | POA: Diagnosis not present

## 2022-06-26 DIAGNOSIS — F02B18 Dementia in other diseases classified elsewhere, moderate, with other behavioral disturbance: Secondary | ICD-10-CM | POA: Diagnosis not present

## 2022-06-26 DIAGNOSIS — G300 Alzheimer's disease with early onset: Secondary | ICD-10-CM | POA: Diagnosis not present

## 2022-06-26 DIAGNOSIS — G939 Disorder of brain, unspecified: Secondary | ICD-10-CM | POA: Diagnosis not present

## 2022-06-27 DIAGNOSIS — J329 Chronic sinusitis, unspecified: Secondary | ICD-10-CM | POA: Diagnosis not present

## 2022-07-07 DIAGNOSIS — Z20822 Contact with and (suspected) exposure to covid-19: Secondary | ICD-10-CM | POA: Diagnosis not present

## 2022-07-23 DIAGNOSIS — I5032 Chronic diastolic (congestive) heart failure: Secondary | ICD-10-CM | POA: Diagnosis not present

## 2022-07-30 DIAGNOSIS — F0280 Dementia in other diseases classified elsewhere without behavioral disturbance: Secondary | ICD-10-CM | POA: Insufficient documentation

## 2022-08-01 DIAGNOSIS — G629 Polyneuropathy, unspecified: Secondary | ICD-10-CM | POA: Diagnosis not present

## 2022-08-01 DIAGNOSIS — M79675 Pain in left toe(s): Secondary | ICD-10-CM | POA: Diagnosis not present

## 2022-08-01 DIAGNOSIS — M79674 Pain in right toe(s): Secondary | ICD-10-CM | POA: Diagnosis not present

## 2022-08-01 DIAGNOSIS — B351 Tinea unguium: Secondary | ICD-10-CM | POA: Diagnosis not present

## 2022-08-01 DIAGNOSIS — R7303 Prediabetes: Secondary | ICD-10-CM | POA: Diagnosis not present

## 2022-08-01 DIAGNOSIS — L600 Ingrowing nail: Secondary | ICD-10-CM | POA: Diagnosis not present

## 2022-08-12 DIAGNOSIS — Z79899 Other long term (current) drug therapy: Secondary | ICD-10-CM | POA: Diagnosis not present

## 2022-08-12 DIAGNOSIS — N1831 Chronic kidney disease, stage 3a: Secondary | ICD-10-CM | POA: Diagnosis not present

## 2022-08-19 DIAGNOSIS — D631 Anemia in chronic kidney disease: Secondary | ICD-10-CM | POA: Diagnosis not present

## 2022-08-19 DIAGNOSIS — F0282 Dementia in other diseases classified elsewhere, unspecified severity, with psychotic disturbance: Secondary | ICD-10-CM | POA: Diagnosis not present

## 2022-08-19 DIAGNOSIS — N189 Chronic kidney disease, unspecified: Secondary | ICD-10-CM | POA: Diagnosis not present

## 2022-08-19 DIAGNOSIS — G309 Alzheimer's disease, unspecified: Secondary | ICD-10-CM | POA: Diagnosis not present

## 2022-08-19 DIAGNOSIS — I129 Hypertensive chronic kidney disease with stage 1 through stage 4 chronic kidney disease, or unspecified chronic kidney disease: Secondary | ICD-10-CM | POA: Diagnosis not present

## 2022-08-19 DIAGNOSIS — I509 Heart failure, unspecified: Secondary | ICD-10-CM | POA: Diagnosis not present

## 2022-08-21 DIAGNOSIS — Z20822 Contact with and (suspected) exposure to covid-19: Secondary | ICD-10-CM | POA: Diagnosis not present

## 2022-09-30 DIAGNOSIS — R634 Abnormal weight loss: Secondary | ICD-10-CM | POA: Diagnosis not present

## 2022-09-30 DIAGNOSIS — R519 Headache, unspecified: Secondary | ICD-10-CM | POA: Diagnosis not present

## 2022-09-30 DIAGNOSIS — G300 Alzheimer's disease with early onset: Secondary | ICD-10-CM | POA: Diagnosis not present

## 2022-09-30 DIAGNOSIS — F02B18 Dementia in other diseases classified elsewhere, moderate, with other behavioral disturbance: Secondary | ICD-10-CM | POA: Diagnosis not present

## 2022-09-30 DIAGNOSIS — G939 Disorder of brain, unspecified: Secondary | ICD-10-CM | POA: Diagnosis not present

## 2022-09-30 DIAGNOSIS — G8929 Other chronic pain: Secondary | ICD-10-CM | POA: Diagnosis not present

## 2022-10-22 DIAGNOSIS — N184 Chronic kidney disease, stage 4 (severe): Secondary | ICD-10-CM | POA: Diagnosis not present

## 2022-10-22 DIAGNOSIS — F02B18 Dementia in other diseases classified elsewhere, moderate, with other behavioral disturbance: Secondary | ICD-10-CM | POA: Diagnosis not present

## 2022-10-22 DIAGNOSIS — R051 Acute cough: Secondary | ICD-10-CM | POA: Diagnosis not present

## 2022-10-22 DIAGNOSIS — G300 Alzheimer's disease with early onset: Secondary | ICD-10-CM | POA: Diagnosis not present

## 2022-10-22 DIAGNOSIS — R32 Unspecified urinary incontinence: Secondary | ICD-10-CM | POA: Diagnosis not present

## 2022-11-11 DIAGNOSIS — I420 Dilated cardiomyopathy: Secondary | ICD-10-CM | POA: Diagnosis not present

## 2022-11-14 DIAGNOSIS — Z111 Encounter for screening for respiratory tuberculosis: Secondary | ICD-10-CM | POA: Diagnosis not present

## 2022-11-14 DIAGNOSIS — R32 Unspecified urinary incontinence: Secondary | ICD-10-CM | POA: Diagnosis not present

## 2022-11-14 DIAGNOSIS — N184 Chronic kidney disease, stage 4 (severe): Secondary | ICD-10-CM | POA: Diagnosis not present

## 2022-11-18 DIAGNOSIS — F329 Major depressive disorder, single episode, unspecified: Secondary | ICD-10-CM | POA: Diagnosis not present

## 2022-11-18 DIAGNOSIS — D631 Anemia in chronic kidney disease: Secondary | ICD-10-CM | POA: Diagnosis not present

## 2022-11-18 DIAGNOSIS — I130 Hypertensive heart and chronic kidney disease with heart failure and stage 1 through stage 4 chronic kidney disease, or unspecified chronic kidney disease: Secondary | ICD-10-CM | POA: Diagnosis not present

## 2022-11-18 DIAGNOSIS — E785 Hyperlipidemia, unspecified: Secondary | ICD-10-CM | POA: Diagnosis not present

## 2022-11-18 DIAGNOSIS — M79675 Pain in left toe(s): Secondary | ICD-10-CM | POA: Diagnosis not present

## 2022-11-18 DIAGNOSIS — B351 Tinea unguium: Secondary | ICD-10-CM | POA: Diagnosis not present

## 2022-11-18 DIAGNOSIS — I509 Heart failure, unspecified: Secondary | ICD-10-CM | POA: Diagnosis not present

## 2022-11-18 DIAGNOSIS — M79674 Pain in right toe(s): Secondary | ICD-10-CM | POA: Diagnosis not present

## 2022-11-18 DIAGNOSIS — G309 Alzheimer's disease, unspecified: Secondary | ICD-10-CM | POA: Diagnosis not present

## 2022-11-18 DIAGNOSIS — N189 Chronic kidney disease, unspecified: Secondary | ICD-10-CM | POA: Diagnosis not present

## 2022-11-18 DIAGNOSIS — F02811 Dementia in other diseases classified elsewhere, unspecified severity, with agitation: Secondary | ICD-10-CM | POA: Diagnosis not present

## 2022-12-08 DIAGNOSIS — Z20828 Contact with and (suspected) exposure to other viral communicable diseases: Secondary | ICD-10-CM | POA: Diagnosis not present

## 2022-12-15 DIAGNOSIS — Z20828 Contact with and (suspected) exposure to other viral communicable diseases: Secondary | ICD-10-CM | POA: Diagnosis not present

## 2022-12-22 DIAGNOSIS — Z20828 Contact with and (suspected) exposure to other viral communicable diseases: Secondary | ICD-10-CM | POA: Diagnosis not present

## 2022-12-29 DIAGNOSIS — Z20828 Contact with and (suspected) exposure to other viral communicable diseases: Secondary | ICD-10-CM | POA: Diagnosis not present

## 2023-01-05 DIAGNOSIS — Z20828 Contact with and (suspected) exposure to other viral communicable diseases: Secondary | ICD-10-CM | POA: Diagnosis not present

## 2023-01-06 DIAGNOSIS — I420 Dilated cardiomyopathy: Secondary | ICD-10-CM | POA: Diagnosis not present

## 2023-01-06 DIAGNOSIS — I5022 Chronic systolic (congestive) heart failure: Secondary | ICD-10-CM | POA: Diagnosis not present

## 2023-01-12 DIAGNOSIS — Z20828 Contact with and (suspected) exposure to other viral communicable diseases: Secondary | ICD-10-CM | POA: Diagnosis not present

## 2023-01-19 DIAGNOSIS — Z20828 Contact with and (suspected) exposure to other viral communicable diseases: Secondary | ICD-10-CM | POA: Diagnosis not present

## 2023-01-26 DIAGNOSIS — Z20828 Contact with and (suspected) exposure to other viral communicable diseases: Secondary | ICD-10-CM | POA: Diagnosis not present

## 2023-02-02 DIAGNOSIS — Z20828 Contact with and (suspected) exposure to other viral communicable diseases: Secondary | ICD-10-CM | POA: Diagnosis not present

## 2023-02-09 DIAGNOSIS — Z20828 Contact with and (suspected) exposure to other viral communicable diseases: Secondary | ICD-10-CM | POA: Diagnosis not present

## 2023-02-10 DIAGNOSIS — I420 Dilated cardiomyopathy: Secondary | ICD-10-CM | POA: Diagnosis not present

## 2023-02-12 DIAGNOSIS — N184 Chronic kidney disease, stage 4 (severe): Secondary | ICD-10-CM | POA: Diagnosis not present

## 2023-02-12 DIAGNOSIS — D631 Anemia in chronic kidney disease: Secondary | ICD-10-CM | POA: Diagnosis not present

## 2023-02-16 DIAGNOSIS — Z20828 Contact with and (suspected) exposure to other viral communicable diseases: Secondary | ICD-10-CM | POA: Diagnosis not present

## 2023-02-17 DIAGNOSIS — F0280 Dementia in other diseases classified elsewhere without behavioral disturbance: Secondary | ICD-10-CM | POA: Diagnosis not present

## 2023-02-17 DIAGNOSIS — N189 Chronic kidney disease, unspecified: Secondary | ICD-10-CM | POA: Diagnosis not present

## 2023-02-17 DIAGNOSIS — G309 Alzheimer's disease, unspecified: Secondary | ICD-10-CM | POA: Diagnosis not present

## 2023-02-17 DIAGNOSIS — I429 Cardiomyopathy, unspecified: Secondary | ICD-10-CM | POA: Diagnosis not present

## 2023-02-17 DIAGNOSIS — I509 Heart failure, unspecified: Secondary | ICD-10-CM | POA: Diagnosis not present

## 2023-02-17 DIAGNOSIS — Z79899 Other long term (current) drug therapy: Secondary | ICD-10-CM | POA: Diagnosis not present

## 2023-02-23 DIAGNOSIS — Z20828 Contact with and (suspected) exposure to other viral communicable diseases: Secondary | ICD-10-CM | POA: Diagnosis not present

## 2023-02-26 DIAGNOSIS — I420 Dilated cardiomyopathy: Secondary | ICD-10-CM | POA: Diagnosis not present

## 2023-02-26 DIAGNOSIS — I340 Nonrheumatic mitral (valve) insufficiency: Secondary | ICD-10-CM | POA: Diagnosis not present

## 2023-02-26 DIAGNOSIS — I447 Left bundle-branch block, unspecified: Secondary | ICD-10-CM | POA: Diagnosis not present

## 2023-02-26 DIAGNOSIS — N1831 Chronic kidney disease, stage 3a: Secondary | ICD-10-CM | POA: Diagnosis not present

## 2023-02-26 DIAGNOSIS — I5022 Chronic systolic (congestive) heart failure: Secondary | ICD-10-CM | POA: Diagnosis not present

## 2023-02-26 DIAGNOSIS — E782 Mixed hyperlipidemia: Secondary | ICD-10-CM | POA: Diagnosis not present

## 2023-02-26 DIAGNOSIS — I10 Essential (primary) hypertension: Secondary | ICD-10-CM | POA: Diagnosis not present

## 2023-03-02 DIAGNOSIS — F02B18 Dementia in other diseases classified elsewhere, moderate, with other behavioral disturbance: Secondary | ICD-10-CM | POA: Diagnosis not present

## 2023-03-02 DIAGNOSIS — R197 Diarrhea, unspecified: Secondary | ICD-10-CM | POA: Diagnosis not present

## 2023-03-02 DIAGNOSIS — G300 Alzheimer's disease with early onset: Secondary | ICD-10-CM | POA: Diagnosis not present

## 2023-03-09 DIAGNOSIS — Z20828 Contact with and (suspected) exposure to other viral communicable diseases: Secondary | ICD-10-CM | POA: Diagnosis not present

## 2023-03-16 DIAGNOSIS — Z20828 Contact with and (suspected) exposure to other viral communicable diseases: Secondary | ICD-10-CM | POA: Diagnosis not present

## 2023-03-21 ENCOUNTER — Emergency Department: Payer: Medicare HMO

## 2023-03-21 ENCOUNTER — Other Ambulatory Visit: Payer: Self-pay

## 2023-03-21 ENCOUNTER — Inpatient Hospital Stay
Admission: EM | Admit: 2023-03-21 | Discharge: 2023-03-27 | DRG: 378 | Disposition: A | Discharge status: Skilled Nursing Facility | Payer: Medicare HMO | Attending: Family Medicine | Admitting: Family Medicine

## 2023-03-21 DIAGNOSIS — R159 Full incontinence of feces: Secondary | ICD-10-CM | POA: Diagnosis present

## 2023-03-21 DIAGNOSIS — I447 Left bundle-branch block, unspecified: Secondary | ICD-10-CM | POA: Diagnosis present

## 2023-03-21 DIAGNOSIS — I130 Hypertensive heart and chronic kidney disease with heart failure and stage 1 through stage 4 chronic kidney disease, or unspecified chronic kidney disease: Secondary | ICD-10-CM | POA: Diagnosis not present

## 2023-03-21 DIAGNOSIS — Z66 Do not resuscitate: Secondary | ICD-10-CM | POA: Diagnosis not present

## 2023-03-21 DIAGNOSIS — Z79899 Other long term (current) drug therapy: Secondary | ICD-10-CM | POA: Diagnosis not present

## 2023-03-21 DIAGNOSIS — I5022 Chronic systolic (congestive) heart failure: Secondary | ICD-10-CM | POA: Diagnosis present

## 2023-03-21 DIAGNOSIS — N179 Acute kidney failure, unspecified: Secondary | ICD-10-CM | POA: Diagnosis not present

## 2023-03-21 DIAGNOSIS — K921 Melena: Secondary | ICD-10-CM | POA: Diagnosis not present

## 2023-03-21 DIAGNOSIS — I429 Cardiomyopathy, unspecified: Secondary | ICD-10-CM

## 2023-03-21 DIAGNOSIS — K219 Gastro-esophageal reflux disease without esophagitis: Secondary | ICD-10-CM | POA: Diagnosis present

## 2023-03-21 DIAGNOSIS — Z882 Allergy status to sulfonamides status: Secondary | ICD-10-CM | POA: Diagnosis not present

## 2023-03-21 DIAGNOSIS — Z515 Encounter for palliative care: Secondary | ICD-10-CM | POA: Diagnosis not present

## 2023-03-21 DIAGNOSIS — F0390 Unspecified dementia without behavioral disturbance: Secondary | ICD-10-CM | POA: Insufficient documentation

## 2023-03-21 DIAGNOSIS — E872 Acidosis, unspecified: Secondary | ICD-10-CM | POA: Diagnosis not present

## 2023-03-21 DIAGNOSIS — E860 Dehydration: Secondary | ICD-10-CM | POA: Diagnosis present

## 2023-03-21 DIAGNOSIS — K861 Other chronic pancreatitis: Secondary | ICD-10-CM | POA: Diagnosis present

## 2023-03-21 DIAGNOSIS — N1831 Chronic kidney disease, stage 3a: Secondary | ICD-10-CM | POA: Diagnosis not present

## 2023-03-21 DIAGNOSIS — F03918 Unspecified dementia, unspecified severity, with other behavioral disturbance: Secondary | ICD-10-CM | POA: Insufficient documentation

## 2023-03-21 DIAGNOSIS — R578 Other shock: Secondary | ICD-10-CM | POA: Diagnosis not present

## 2023-03-21 DIAGNOSIS — I10 Essential (primary) hypertension: Secondary | ICD-10-CM | POA: Insufficient documentation

## 2023-03-21 DIAGNOSIS — Z95810 Presence of automatic (implantable) cardiac defibrillator: Secondary | ICD-10-CM | POA: Insufficient documentation

## 2023-03-21 DIAGNOSIS — F17210 Nicotine dependence, cigarettes, uncomplicated: Secondary | ICD-10-CM | POA: Diagnosis present

## 2023-03-21 DIAGNOSIS — R001 Bradycardia, unspecified: Secondary | ICD-10-CM | POA: Diagnosis not present

## 2023-03-21 DIAGNOSIS — K922 Gastrointestinal hemorrhage, unspecified: Secondary | ICD-10-CM | POA: Diagnosis not present

## 2023-03-21 DIAGNOSIS — Z20828 Contact with and (suspected) exposure to other viral communicable diseases: Secondary | ICD-10-CM | POA: Diagnosis not present

## 2023-03-21 DIAGNOSIS — Z7982 Long term (current) use of aspirin: Secondary | ICD-10-CM

## 2023-03-21 DIAGNOSIS — D649 Anemia, unspecified: Secondary | ICD-10-CM | POA: Diagnosis present

## 2023-03-21 DIAGNOSIS — R55 Syncope and collapse: Secondary | ICD-10-CM | POA: Diagnosis not present

## 2023-03-21 DIAGNOSIS — N171 Acute kidney failure with acute cortical necrosis: Secondary | ICD-10-CM | POA: Diagnosis not present

## 2023-03-21 DIAGNOSIS — I959 Hypotension, unspecified: Secondary | ICD-10-CM

## 2023-03-21 DIAGNOSIS — D509 Iron deficiency anemia, unspecified: Secondary | ICD-10-CM | POA: Insufficient documentation

## 2023-03-21 DIAGNOSIS — K449 Diaphragmatic hernia without obstruction or gangrene: Secondary | ICD-10-CM | POA: Diagnosis present

## 2023-03-21 DIAGNOSIS — Z800 Family history of malignant neoplasm of digestive organs: Secondary | ICD-10-CM

## 2023-03-21 DIAGNOSIS — F03B18 Unspecified dementia, moderate, with other behavioral disturbance: Secondary | ICD-10-CM | POA: Diagnosis present

## 2023-03-21 DIAGNOSIS — Z8619 Personal history of other infectious and parasitic diseases: Secondary | ICD-10-CM | POA: Diagnosis not present

## 2023-03-21 DIAGNOSIS — R32 Unspecified urinary incontinence: Secondary | ICD-10-CM | POA: Diagnosis present

## 2023-03-21 DIAGNOSIS — Z8249 Family history of ischemic heart disease and other diseases of the circulatory system: Secondary | ICD-10-CM | POA: Diagnosis not present

## 2023-03-21 DIAGNOSIS — Z7189 Other specified counseling: Secondary | ICD-10-CM | POA: Diagnosis not present

## 2023-03-21 DIAGNOSIS — D500 Iron deficiency anemia secondary to blood loss (chronic): Secondary | ICD-10-CM | POA: Diagnosis not present

## 2023-03-21 DIAGNOSIS — R0902 Hypoxemia: Secondary | ICD-10-CM | POA: Diagnosis not present

## 2023-03-21 DIAGNOSIS — E861 Hypovolemia: Secondary | ICD-10-CM | POA: Diagnosis not present

## 2023-03-21 HISTORY — DX: Unspecified dementia, unspecified severity, without behavioral disturbance, psychotic disturbance, mood disturbance, and anxiety: F03.90

## 2023-03-21 LAB — TYPE AND SCREEN
ABO/RH(D): A NEG
Antibody Screen: NEGATIVE
Unit division: 0
Unit division: 0

## 2023-03-21 LAB — BPAM RBC
Blood Product Expiration Date: 202406012359
ISSUE DATE / TIME: 202405181514
ISSUE DATE / TIME: 202405181514
Unit Type and Rh: 600
Unit Type and Rh: 600

## 2023-03-21 LAB — COMPREHENSIVE METABOLIC PANEL
ALT: 9 U/L (ref 0–44)
AST: 14 U/L — ABNORMAL LOW (ref 15–41)
Albumin: 3.7 g/dL (ref 3.5–5.0)
Alkaline Phosphatase: 82 U/L (ref 38–126)
Anion gap: 6 (ref 5–15)
BUN: 34 mg/dL — ABNORMAL HIGH (ref 8–23)
CO2: 23 mmol/L (ref 22–32)
Calcium: 8.3 mg/dL — ABNORMAL LOW (ref 8.9–10.3)
Chloride: 109 mmol/L (ref 98–111)
Creatinine, Ser: 2.44 mg/dL — ABNORMAL HIGH (ref 0.44–1.00)
GFR, Estimated: 22 mL/min — ABNORMAL LOW (ref >60)
Glucose, Bld: 112 mg/dL — ABNORMAL HIGH (ref 70–99)
Potassium: 4.9 mmol/L (ref 3.5–5.1)
Sodium: 138 mmol/L (ref 135–145)
Total Bilirubin: 0.5 mg/dL (ref 0.3–1.2)
Total Protein: 6.7 g/dL (ref 6.5–8.1)

## 2023-03-21 LAB — HEMOGLOBIN: Hemoglobin: 10.8 g/dL — ABNORMAL LOW (ref 12.0–15.0)

## 2023-03-21 LAB — CBC
HCT: 25.9 % — ABNORMAL LOW (ref 36.0–46.0)
Hemoglobin: 7.9 g/dL — ABNORMAL LOW (ref 12.0–15.0)
MCH: 26.3 pg (ref 26.0–34.0)
MCHC: 30.5 g/dL (ref 30.0–36.0)
MCV: 86.3 fL (ref 80.0–100.0)
Platelets: 262 10*3/uL (ref 150–400)
RBC: 3 MIL/uL — ABNORMAL LOW (ref 3.87–5.11)
RDW: 15.4 % (ref 11.5–15.5)
WBC: 11 10*3/uL — ABNORMAL HIGH (ref 4.0–10.5)
nRBC: 0 % (ref 0.0–0.2)

## 2023-03-21 LAB — PREPARE RBC (CROSSMATCH)

## 2023-03-21 LAB — ABO/RH: ABO/RH(D): A NEG

## 2023-03-21 LAB — LIPASE, BLOOD: Lipase: 28 U/L (ref 11–51)

## 2023-03-21 MED ORDER — SODIUM CHLORIDE 0.9 % IV BOLUS
1000.0000 mL | Freq: Once | INTRAVENOUS | Status: AC
  Administered 2023-03-21: 1000 mL via INTRAVENOUS

## 2023-03-21 MED ORDER — BUPROPION HCL ER (XL) 150 MG PO TB24
150.0000 mg | ORAL_TABLET | Freq: Every day | ORAL | Status: DC
  Administered 2023-03-23 – 2023-03-27 (×5): 150 mg via ORAL
  Filled 2023-03-21 (×5): qty 1

## 2023-03-21 MED ORDER — SODIUM CHLORIDE 0.9% IV SOLUTION
Freq: Once | INTRAVENOUS | Status: DC
  Filled 2023-03-21: qty 250

## 2023-03-21 MED ORDER — SODIUM CHLORIDE 0.9 % IV SOLN: 10.0000 mL/h | Freq: Once | INTRAVENOUS | Status: DC

## 2023-03-21 MED ORDER — PANTOPRAZOLE SODIUM 40 MG IV SOLR: 40.0000 mg | INTRAVENOUS | Status: DC

## 2023-03-21 MED ORDER — HALOPERIDOL LACTATE 5 MG/ML IJ SOLN: 1.0000 mg | Freq: Four times a day (QID) | INTRAMUSCULAR | Status: DC | PRN

## 2023-03-21 MED ORDER — PANTOPRAZOLE SODIUM 40 MG IV SOLR
40.0000 mg | Freq: Once | INTRAVENOUS | Status: AC
  Administered 2023-03-21: 40 mg via INTRAVENOUS
  Filled 2023-03-21: qty 10

## 2023-03-21 MED ORDER — ACETAMINOPHEN 650 MG RE SUPP: 650.0000 mg | Freq: Four times a day (QID) | RECTAL | Status: DC | PRN

## 2023-03-21 MED ORDER — ONDANSETRON HCL 4 MG PO TABS: 4.0000 mg | ORAL_TABLET | Freq: Four times a day (QID) | ORAL | Status: DC | PRN

## 2023-03-21 MED ORDER — ONDANSETRON HCL 4 MG/2ML IJ SOLN: 4.0000 mg | Freq: Four times a day (QID) | INTRAMUSCULAR | Status: DC | PRN

## 2023-03-21 MED ORDER — RIVASTIGMINE TARTRATE 3 MG PO CAPS
3.0000 mg | ORAL_CAPSULE | Freq: Two times a day (BID) | ORAL | Status: DC
  Administered 2023-03-21 – 2023-03-27 (×11): 3 mg via ORAL
  Filled 2023-03-21 (×13): qty 1

## 2023-03-21 MED ORDER — MELATONIN 5 MG PO TABS
10.0000 mg | ORAL_TABLET | Freq: Every day | ORAL | Status: DC
  Administered 2023-03-21 – 2023-03-26 (×6): 10 mg via ORAL
  Filled 2023-03-21 (×6): qty 2

## 2023-03-21 MED ORDER — SODIUM CHLORIDE 0.9% FLUSH
3.0000 mL | Freq: Two times a day (BID) | INTRAVENOUS | Status: DC
  Administered 2023-03-21 – 2023-03-24 (×4): 3 mL via INTRAVENOUS

## 2023-03-21 MED ORDER — ACETAMINOPHEN 325 MG PO TABS
650.0000 mg | ORAL_TABLET | Freq: Four times a day (QID) | ORAL | Status: DC | PRN
  Administered 2023-03-23 – 2023-03-25 (×3): 650 mg via ORAL
  Filled 2023-03-21 (×3): qty 2

## 2023-03-21 MED ORDER — ATORVASTATIN CALCIUM 20 MG PO TABS
20.0000 mg | ORAL_TABLET | Freq: Every day | ORAL | Status: DC
  Administered 2023-03-21 – 2023-03-27 (×6): 20 mg via ORAL
  Filled 2023-03-21 (×6): qty 1

## 2023-03-21 NOTE — Consult Note (Signed)
Katrina Rose , MD [address], [address], [place], [place], [address] [address], [address], [place], [place], [address] Phone: [phone]  Fax: [phone]  Consultation  Referring Provider:     Dr Fanny Bien Primary Care Physician:  Kandyce Rud, MD Primary Gastroenterologist:  Dr. Timothy Lasso          Reason for Consultation:     GI bleed  Date of Admission:  03/21/2023 Date of Consultation:  03/21/2023         HPI:   Katrina Rose is a 62 y.o. female who suffers from dementia.  Has a history of iron deficiency anemia follows with Dr. Timothy Lasso as an outpatient last seen at the office about a year back in March 2023.  Has a history of reflux-like symptoms.  EGD and colonoscopy and 2022 showed no abnormalities.  Known to have a hiatal hernia.  Colonoscopy did show diverticulosis and internal hemorrhoids.   Patient is a poor historian to the ER with feeling very weak and the ER had a large amount of loose incontinent stools hypotensive.  In the ER hemoglobin was 7.9 g with an MCV of 86 was 10.8 g 1 year back and 6 years back was 8.6 g.  CMP creatinine of 2.44 with a BUN of 34.  Spoke with her niece as patient has dementia and poor historian.  She states that she has not been drinking and eating much recently was started on Depakote a few days back.  She had been previously on Lasix that has been stopped also has been on Aldactone which has also been held she is also on lisinopril.  She states that there has not been any clear history of a change in bowel habits she always has darker colored stools she has been on iron.  Denies any abdominal pain or history of heartburn.  I spoke with the nurse in the ER and the stool was gray in color when last checked. CT abdomen without con rest performed showed no acute abnormalities.    Past Medical History:  Diagnosis Date   AICD (automatic cardioverter/defibrillator) present    CHF (congestive heart failure) (HCC)    Dementia (HCC)    GERD  (gastroesophageal reflux disease)    Hypertension    IDA (iron deficiency anemia)    Insomnia    LBBB (left bundle branch block)     Past Surgical History:  Procedure Laterality Date   ANKLE SURGERY Left    CARDIAC CATHETERIZATION     COLONOSCOPY     COLONOSCOPY WITH PROPOFOL N/A 10/07/2021   Procedure: COLONOSCOPY WITH PROPOFOL;  Surgeon: Jaynie Collins, DO;  Location: [facility] END[facility];  Service: Gastroenterology;  Laterality: N/A;   debridement and closure sternal wound     DILATION AND CURETTAGE OF UTERUS     ESOPHAGOGASTRODUODENOSCOPY     ESOPHAGOGASTRODUODENOSCOPY (EGD) WITH PROPOFOL N/A 10/07/2021   Procedure: ESOPHAGOGASTRODUODENOSCOPY (EGD) WITH PROPOFOL;  Surgeon: Jaynie Collins, DO;  Location: [facility] ENDOSCOPY;  Service: Gastroenterology;  Laterality: N/A;   ICD GENERATOR CHANGEOUT N/A 08/01/2021   Procedure: ICD GENERATOR CHANGEOUT;  Surgeon: Marcina Millard, MD;  Location: [facility] INVASIVE CV LAB;  Service: Cardiovascular;  Laterality: N/A;   pacermaker/ defibrilater   2010   TUBAL LIGATION      Prior to Admission medications   Medication Sig Start Date End Date Taking? Authorizing Provider  aspirin EC 81 MG tablet Take 81 mg by mouth daily.    [provider]  atorvastatin (LIPITOR)  20 MG tablet Take 20 mg by mouth daily.    [provider]  buPROPion (WELLBUTRIN XL) 150 MG 24 hr tablet Take 150 mg by mouth daily.    [provider]  carvedilol (COREG) 12.5 MG tablet Take 12.5 mg by mouth 2 (two) times daily with a meal.    [provider]  cephALEXin (KEFLEX) 250 MG capsule Take 2 capsules (500 mg total) by mouth 2 (two) times daily. 08/01/21   Paraschos, Alexander, MD  furosemide (LASIX) 20 MG tablet Take 20 mg by mouth daily. 01/13/17   [provider]  lisinopril (ZESTRIL) 5 MG tablet Take 5 mg by mouth in the morning and at bedtime.    [provider]  pantoprazole (PROTONIX) 40 MG tablet Take 40 mg by  mouth daily.    [provider]  Probiotic Product (PROBIOTIC DAILY PO) Take 1 capsule by mouth daily.    [provider]  spironolactone (ALDACTONE) 25 MG tablet Take 25 mg by mouth daily.    [provider]    Family History  Problem Relation Age of Onset   CAD Mother    Colon cancer Father      Social History   Tobacco Use   Smoking status: Some Days    Types: Cigarettes   Smokeless tobacco: Never   Tobacco comments:    occasional  Substance Use Topics   Alcohol use: Yes    Comment: rarely   Drug use: No    Allergies as of 03/21/2023 - Review Complete 03/21/2023  Allergen Reaction Noted   Sulfa antibiotics Hives 07/20/2016    Review of Systems:    All systems reviewed and negative except where noted in HPI.   Physical Exam:  Vital signs in last 24 hours: Temp:  [97.9 F (36.6 C)-98.1 F (36.7 C)] 97.9 F (36.6 C) (05/18 1545) Pulse Rate:  [63-73] 68 (05/18 1615) Resp:  [13-20] 18 (05/18 1615) BP: (83-94)/(37-70) 94/70 (05/18 1615) SpO2:  [99 %-100 %] 100 % (05/18 1615) Weight:  [61.6 kg] 61.6 kg (05/18 1440)   General: Appears comfortable not in any pain or distress Head:  Normocephalic and atraumatic. Eyes:   No icterus.   Conjunctiva pink. PERRLA. Ears:  Normal auditory acuity. Neck:  Supple; no masses or thyroidomegaly Lungs: Respirations even and unlabored. Lungs clear to auscultation bilaterally.   No wheezes, crackles, or rhonchi.  Heart:  Regular rate and rhythm;  Without murmur, clicks, rubs or gallops Abdomen:  Soft, nondistended, nontender. Normal bowel sounds. No appreciable masses or hepatomegaly.  No rebound or guarding.  Neurologic:  Alert and oriented x1  Skin:  Intact without significant lesions or rashes. Cervical Nodes:  No significant cervical adenopathy. Psych: Awake and alert appears comfortable  LAB RESULTS: Recent Labs    03/21/23 1442  WBC 11.0*  HGB 7.9*  HCT 25.9*  PLT 262   BMET Recent Labs     03/21/23 1442  NA 138  K 4.9  CL 109  CO2 23  GLUCOSE 112*  BUN 34*  CREATININE 2.44*  CALCIUM 8.3*   LFT Recent Labs    03/21/23 1442  PROT 6.7  ALBUMIN 3.7  AST 14*  ALT 9  ALKPHOS 82  BILITOT 0.5   PT/INR No results for input(s): "LABPROT", "INR" in the last 72 hours.  STUDIES: CT ABDOMEN PELVIS WO CONTRAST  Result Date: 03/21/2023 CLINICAL DATA:  Lower GI bleed. EXAM: CT ABDOMEN AND PELVIS WITHOUT CONTRAST TECHNIQUE: Multidetector CT imaging of  the abdomen and pelvis was performed following the standard protocol without IV contrast. RADIATION DOSE REDUCTION: This exam was performed according to the departmental dose-optimization program which includes automated exposure control, adjustment of the mA and/or kV according to patient size and/or use of iterative reconstruction technique. COMPARISON:  Two-view chest x-ray 02/20/2017. CT of the chest without contrast 06/04/2012. FINDINGS: Lower chest: An ill-defined nodular density in the medial right lower lobe may be partial volume of the hila. Mild dependent atelectasis is present. The lungs are otherwise clear Hepatobiliary: No focal liver abnormality is seen. No gallstones, gallbladder wall thickening, or biliary dilatation. Pancreas: Scattered calcifications are present throughout the pancreas. No acute inflammatory changes present. Spleen: Normal in size without focal abnormality. Adrenals/Urinary Tract: Adrenal glands are normal bilaterally. Kidneys are unremarkable. No stone or obstruction is present. The ureters are normal. The urinary bladder is unremarkable. Stomach/Bowel: Stomach and duodenum are within normal limits. The small bowel is unremarkable. The terminal vision scratched at the terminal ileum is visualized and normal. The appendix is normal. The ascending and transverse colon are within normal limits. Descending and sigmoid colon are normal. Vascular/Lymphatic: Atherosclerotic calcifications are present within the  cavernous internal carotid arteries. No aneurysm is present. No significant adenopathy is present. Reproductive: Uterus and bilateral adnexa are unremarkable. Other: No abdominal wall hernia or abnormality. No abdominopelvic ascites. Musculoskeletal: The vertebral body heights and alignment normal. Bony pelvis is within limits. No acute or focal osseous abnormalities are present. The hips are visualized normal bilaterally. IMPRESSION: 1. No acute or focal lesion to explain the patient's lower GI bleed. 2. Scattered calcifications throughout the pancreas compatible with chronic pancreatitis. No acute inflammatory changes present. 3. Ill-defined nodular density in the medial right lower lobe may be partial volume of the hila. Electronically Signed   By: Marin Roberts M.D.   On: 03/21/2023 16:18      Impression / Plan:   Katrina Rose is a 62 y.o. y/o female with known history of dementia, hiatal hernia, reflux, iron deficiency anemia.  In 2022 had EGD and colonoscopy as part of evaluation for iron deficiency anemia no abnormalities except hiatal hernia and diverticulosis of the colon was seen.  Comes into the hospital with weakness and had a large amount of loose, per nursing was dark gray in color but not black or red.  Was hypotensive in the EMS.  She is anemic.  No gross elevation in BUN/creatinine ratio.  Impression : The patient presently has acute kidney injury which could be due to combination of poor oral intake as well as the effects of lisinopril.  She has been drowsy recently could be related to commencement of Depakote.  Although the patient is anemic apparently she has been anemic for a long time and has not been a change in the color of her stool it has been dark gray no red or black color stools seem to come from to the nurse who said her stool was gray in color in the ER.  She probably has a chronic anemia probably related to her hiatal hernia.  The stool occult can be positive from  long-term use of iron tablets.  Plan 1.  Monitor CBC and transfuse as needed 2.  EGD tomorrow 3.  If EGD is negative ideally she would require a colonoscopy but I am not sure if she would tolerate a bowel prep. 4.  Rehydrate And if creatinine improves and has further bleeding a CT angiogram can be considered if has features  of an acute GI bleed.  Thank you for involving me in the care of this patient.   I have discussed alternative options, risks & benefits,  which include, but are not limited to, bleeding, infection, perforation,respiratory complication & drug reaction.  The patient agrees with this plan & written consent will be obtained.      LOS: 0 days   Katrina Mood, MD  03/21/2023, 4:31 PM

## 2023-03-21 NOTE — Assessment & Plan Note (Addendum)
Symptomatic anemia/melena Patient with a presyncopal episode, hypotensive with EMS, hemoglobin 7.9, down from 9.7 on 1/24 with positive stool guaiac Daughter reports dark tarry stool since December 2023 CT abdomen pelvis without contrast was nonacute Seen by gastroenterologist, Dr. Tobi Bastos believes anemia might be more chronic N.p.o. for EGD Continue Protonix Received 2 units emergency release PRBCs in the ED Hb 7.9>10.8 Serial H&H and transfuse further if needed

## 2023-03-21 NOTE — ED Provider Notes (Signed)
Discussed case, clinical assessment, exam, hemodynamics, with Dr. Tobi Bastos of GI.  They recommend starting Protonix which I have ordered and will see the patient in consult today. Anticipated EGD probably tomorrow   Sharyn Creamer, MD 03/21/23 1558

## 2023-03-21 NOTE — Assessment & Plan Note (Addendum)
History of diarrhea 2 weeks prior, tested negative for C. difficile Will do stool studies if develops diarrhea otherwise infectious diarrhea not suspected

## 2023-03-21 NOTE — Assessment & Plan Note (Addendum)
Patient with moderate dementia, incontinent of stool and urine at baseline and needs full-time care Currently on bupropion, Exelon, quetiapine and recently started Depakote Will continue Exelon and bupropion.  Hold off on recently started Depakote given possible contribution to etiology and reintroduce as appropriate.   Haldol as needed IV Delirium precautions: -Monitor and correct any metabolic abnormalities, infections and electrolyte imbalances which can contribute to altered mental status. -Minimize use of narcotics, benzodiazepines, anti cholingeneric, and antihistamine medications as these drug classes can aggravate delirium -Patient would benefit from staying awake during the daylight hours. Lights off at nighttime to facilitate sleep better

## 2023-03-21 NOTE — ED Provider Notes (Signed)
[facility] Provider Note    Event Date/Time   First MD Initiated Contact with Patient 03/21/23 1456     (approximate)   History   Dizziness  EM caveat: Limitation due to acuity of illness, also poor historian and history of cognitive impairment/dementia  HPI  Katrina Rose is a 62 y.o. female history of Alzheimer disease versus early frontotemporal dementia   Per EMS patient was outside when she had a episode of feeling extremely weak and near syncope.  EMS reports patient had a large amount of loose incontinent stool.  Hypotensive with EMS  Patient's daughter reports that mother has had some issues with renal function recently due to furosemide use.  Physical Exam   Triage Vital Signs: ED Triage Vitals  Enc Vitals Group     BP 03/21/23 1441 (!) 86/57     Pulse Rate 03/21/23 1441 65     Resp 03/21/23 1441 18     Temp 03/21/23 1441 98.1 F (36.7 C)     Temp Source 03/21/23 1441 Oral     SpO2 03/21/23 1441 100 %     Weight 03/21/23 1440 135 lb 12.9 oz (61.6 kg)     Height 03/21/23 1440 5\' 1"  (1.549 m)     Head Circumference --      Peak Flow --      Pain Score 03/21/23 1441 0     Pain Loc --      Pain Edu? --      Excl. in GC? --     Most recent vital signs: Vitals:   03/21/23 1517 03/21/23 1530  BP: (!) 89/60 (!) 87/69  Pulse: 73 73  Resp: 20 13  Temp: 98.1 F (36.7 C)   SpO2: 99% 100%     General: Awake, no distress.  She is disoriented to month and date but does recall being outside reports that she started feeling extremely weak and had a large bowel movement CV:  Good peripheral perfusion.  Normal tones and rate Resp:  Normal effort.  There are bilateral Abd:  No distention.  Soft nontender nondistended throughout all fields/quadrant Other:  Skin is cool to touch somewhat pale.  Mucous membranes appear dry Dark formed to slightly loose stool present, Hemoccult control positive, test positive for blood on Hemoccult  ED  Results / Procedures / Treatments   Labs (all labs ordered are listed, but only abnormal results are displayed) Labs Reviewed  CBC - Abnormal; Notable for the following components:      Result Value   WBC 11.0 (*)    RBC 3.00 (*)    Hemoglobin 7.9 (*)    HCT 25.9 (*)    All other components within normal limits  COMPREHENSIVE METABOLIC PANEL - Abnormal; Notable for the following components:   Glucose, Bld 112 (*)    BUN 34 (*)    Creatinine, Ser 2.44 (*)    Calcium 8.3 (*)    AST 14 (*)    GFR, Estimated 22 (*)    All other components within normal limits  LIPASE, BLOOD  TYPE AND SCREEN  PREPARE RBC (CROSSMATCH)  PREPARE RBC (CROSSMATCH)  ABO/RH   Labs interpreted as anemia, the acuity of it is somewhat unclear though.  Hemoglobin 7.9.  Previous to that hemoglobin 10.8 though this was approximately a year ago  Creatinine 2.4  EKG  Interpreted by me at 1445 heart rate 65 QRS 130 QTc 500 Sinus rhythm, left bundle branch block  RADIOLOGY  CT GI bleed study initially ordered, but given the patient's noted creatinine this could not be performed.  Noncontrast CT will be utilized to evaluate for acute gross abnormality, mass lesion or other thing that might explain concern for bleeding but obviously is not an ideal study for GI bleeding.      PROCEDURES:  Critical Care performed: Yes, see critical care procedure note(s)  CRITICAL CARE Performed by: Sharyn Creamer   Total critical care time: 35 minutes  Critical care time was exclusive of separately billable procedures and treating other patients.  Critical care was necessary to treat or prevent imminent or life-threatening deterioration.  Critical care was time spent personally by me on the following activities: development of treatment plan with patient and/or surrogate as well as nursing, discussions with consultants, evaluation of patient's response to treatment, examination of patient, obtaining history from  patient or surrogate, ordering and performing treatments and interventions, ordering and review of laboratory studies, ordering and review of radiographic studies, pulse oximetry and re-evaluation of patient's condition.   Procedures   MEDICATIONS ORDERED IN ED: Medications  0.9 %  sodium chloride infusion (has no administration in time range)  0.9 %  sodium chloride infusion (Manually program via Guardrails IV Fluids) (has no administration in time range)  sodium chloride 0.9 % bolus 1,000 mL (0 mLs Intravenous Stopped 03/21/23 1542)  pantoprazole (PROTONIX) injection 40 mg (40 mg Intravenous Given 03/21/23 1516)     IMPRESSION / MDM / ASSESSMENT AND PLAN / ED COURSE  I reviewed the triage vital signs and the nursing notes.                              Differential diagnosis includes, but is not limited to, massive GI bleeding, dehydration, cardiac dysrhythmia, metabolic abnormality, near syncope, arrhythmia, etc.  Based on the patient's presentation, large bowel movement that is dark extremely heme positive high suspicion for acute gastrointestinal bleeding.  Also noted is significant acute kidney injury suggestive of prerenal etiology though not yet certain.  No associate abdominal pain.  Patient denies having abdominal pain.  Patient's presentation is most consistent with acute presentation with potential threat to life or bodily function.   The patient is on the cardiac monitor to evaluate for evidence of arrhythmia and/or significant heart rate changes.  Vitals:   03/21/23 1517 03/21/23 1530  BP: (!) 89/60 (!) 87/69  Pulse: 73 73  Resp: 20 13  Temp: 98.1 F (36.7 C)   SpO2: 99% 100%     ----------------------------------------- 3:40 PM on 03/21/2023 ----------------------------------------- Daughter at bedside.  Had a family conversation between daughter as well as Phil Dopp, the patient's sister who is her healthcare power of attorney.  Daughter at the bedside and her  healthcare power of attorney both advised that she is DO NOT RESUSCITATE and DO NOT INTUBATE.  They are okay with her receiving blood transfusion, and agreeable to proceed with transfusion if needed and as needed.  However they both advised that the patient has previously expressed and they wish to express that she is not to receive resuscitative measures including intubation or CPR  Ongoing care assigned to Dr. Scotty Court.  Continue to follow, manage hemodynamics, follow-up on pending studies including CT imaging and GI consultation    FINAL CLINICAL IMPRESSION(S) / ED DIAGNOSES   Final diagnoses:  Acute GI bleeding  Hypotension, unspecified hypotension type  Hemorrhagic shock (HCC)  AKI (acute kidney injury) (HCC)  Rx / DC Orders   ED Discharge Orders     None        Note:  This document was prepared using Dragon voice recognition software and may include unintentional dictation errors.   Sharyn Creamer, MD 03/21/23 [phone]

## 2023-03-21 NOTE — H&P (Signed)
History and Physical    Patient: Katrina Rose ZOX:[id number] DOB: 1961/05/05 DOA: 03/21/2023 DOS: the patient was seen and examined on 03/21/2023 PCP: Kandyce Rud, MD  Patient coming from: Home  Chief Complaint:  Chief Complaint  Patient presents with   Dizziness    HPI: Katrina Rose is a 62 y.o. female with medical history significant for hypertension, early onset dementia  , chronic systolic CHF (EF 30 to 35% 07/2021) , LBBB, cardiomyopathy s/p implantable ICD recently doing well from a cardiovascular standpoint when last seen by her cardiologist on 02/26/2023 who presents to the ED with a syncopal episode.  Most of the history is given by her daughter Encarnacion Slates over the phone who is an ICU nurse.  Patient lives alone but family monitors her by video and she has a neighbor who checks in on her several times a day.  During a routine check at around noon on 5/18, patient was found slumped and unresponsive at the table.  EMS was called by which time she started to respond.  She was hypotensive with systolic in the 60s with EMS.  Daughter reports that she had diarrhea 2 weeks prior which resolved but had not been rehydrating adequately.  Given her history of hospitalization for C. difficile in 2017, a C. difficile test was done which was negative.  She does state that her mother has been having dark tarry and sticky stools since December 2023.  Three days prior to the syncopal episode on the day of arrival, she was started on Depakote and her twice daily Seroquel dose was switched to nightly.  She has had no vomiting, fever or chills or abdominal pain.  At baseline patient is incontinent of urine and stool and family is trying to place her in a 24-hour memory care unit. ED course and data review:.   On arrival BP 86/57 with pulse 65 and otherwise normal vitals.  BP was fluid responsive to 108/68. Labs significant for hemoglobin of 7.9, down from 9.7 on 1/24 and creatinine 2.44 up from 1.2 on  4/11(previously 2.0 in October 2023).  WBC 11,000, lipase and LFTs WNL.  Stool guaiac positive. EKG, personally reviewed and interpreted showing sinus at 64 with LBBB CT abdomen pelvis (without contrast due to renal function) showing no acute or focal lesion to explain lower GI bleed, scattered calcifications consistent with chronic pancreatitis and other nonacute findings. Patient was given a 1 L IV fluid bolus, IV Protonix 40 mg and started on 2 units emergency release PRBCs. Gastroenterologist, Dr. Tobi Bastos was consulted, who saw the patient and opined that her anemia is probably chronic.  He nonetheless advised on EGD in the a.m. and colonoscopy if patient can tolerate a bowel prep.  Advises to hydrate to improve creatinine and if features of acute GI bleed then recommends CT angiogram. Hospitalist consulted for admission.   Review of Systems: As mentioned in the history of present illness. All other systems reviewed and are negative.  Past Medical History:  Diagnosis Date   AICD (automatic cardioverter/defibrillator) present    CHF (congestive heart failure) (HCC)    Dementia (HCC)    GERD (gastroesophageal reflux disease)    Hypertension    IDA (iron deficiency anemia)    Insomnia    LBBB (left bundle branch block)    Past Surgical History:  Procedure Laterality Date   ANKLE SURGERY Left    CARDIAC CATHETERIZATION     COLONOSCOPY     COLONOSCOPY WITH PROPOFOL N/A 10/07/2021  Procedure: COLONOSCOPY WITH PROPOFOL;  Surgeon: Jaynie Collins, DO;  Location[facility] ENDOSCOPY;  Service: Gastroenterology;  Laterality: N/A;   debridement and closure sternal wound     DILATION AND CURETTAGE OF UTERUS     ESOPHAGOGASTRODUODENOSCOPY     ESOPHAGOGASTRODUODENOSCOPY (EGD) WITH PROPOFOL N/A 10/07/2021   Procedure: ESOPHAGOGASTRODUODENOSCOPY (EGD) WITH PROPOFOL;  Surgeon: Jaynie Collins, DO;  Location: [facility] ENDOSCOPY;  Service: Gastroenterology;  Laterality: N/A;   ICD GENERATOR CHANGEOUT  N/A 08/01/2021   Procedure: ICD GENERATOR CHANGEOUT;  Surgeon: Marcina Millard, MD;  Location: [facility] INVASIVE CV LAB;  Service: Cardiovascular;  Laterality: N/A;   pacermaker/ defibrilater   2010   TUBAL LIGATION     Social History:  reports that she has been smoking cigarettes. She has never used smokeless tobacco. She reports current alcohol use. She reports that she does not use drugs.  Allergies  Allergen Reactions   Sulfa Antibiotics Hives    Family History  Problem Relation Age of Onset   CAD Mother    Colon cancer Father     Prior to Admission medications   Medication Sig Start Date End Date Taking? Authorizing Provider  benzonatate (TESSALON) 200 MG capsule Take 200 mg by mouth 3 (three) times daily as needed. 10/22/22  Yes [provider]  divalproex (DEPAKOTE) 250 MG DR tablet Take by mouth. 03/16/23 04/15/23 Yes [provider]  famotidine (PEPCID) 40 MG tablet Take 1 tablet by mouth at bedtime. 04/07/22 04/07/23 Yes [provider]  QUEtiapine (SEROQUEL) 50 MG tablet Take 1 tablet by mouth 2 (two) times daily. 01/12/23  Yes [provider]  rivastigmine (EXELON) 3 MG capsule Take by mouth. 06/26/22 06/21/23 Yes [provider]  aspirin EC 81 MG tablet Take 81 mg by mouth daily.    [provider]  atorvastatin (LIPITOR) 20 MG tablet Take 20 mg by mouth daily.    [provider]  buPROPion (WELLBUTRIN XL) 150 MG 24 hr tablet Take 150 mg by mouth daily.    [provider]  carvedilol (COREG) 12.5 MG tablet Take 12.5 mg by mouth 2 (two) times daily with a meal.    [provider]  cephALEXin (KEFLEX) 250 MG capsule Take 2 capsules (500 mg total) by mouth 2 (two) times daily. Patient not taking: Reported on 03/21/2023 08/01/21   Marcina Millard, MD  furosemide (LASIX) 20 MG tablet Take 20 mg by mouth daily. 01/13/17   [provider]  lisinopril (ZESTRIL) 5 MG tablet Take 5 mg by mouth in  the morning and at bedtime.    [provider]  pantoprazole (PROTONIX) 40 MG tablet Take 40 mg by mouth daily.    [provider]  Probiotic Product (PROBIOTIC DAILY PO) Take 1 capsule by mouth daily.    [provider]  spironolactone (ALDACTONE) 25 MG tablet Take 25 mg by mouth daily.    [provider]    Physical Exam: Vitals:   03/21/23 1745 03/21/23 1800 03/21/23 1815 03/21/23 1850  BP: 109/70 108/68 127/70 (!) 128/50  Pulse:  (!) 43    Resp: 20 16 14 18   Temp:    97.7 F (36.5 C)  TempSrc:    Oral  SpO2: 98% 96%    Weight:      Height:       Physical Exam Vitals and nursing note reviewed.  Constitutional:      General: She is not in acute distress. HENT:     Head: Normocephalic and  atraumatic.  Cardiovascular:     Rate and Rhythm: Normal rate and regular rhythm.     Heart sounds: Normal heart sounds.  Pulmonary:     Effort: Pulmonary effort is normal.     Breath sounds: Normal breath sounds.  Abdominal:     Palpations: Abdomen is soft.     Tenderness: There is no abdominal tenderness.  Neurological:     General: No focal deficit present.     Mental Status: She is disoriented.     Labs on Admission: I have personally reviewed following labs and imaging studies  CBC: Recent Labs  Lab 03/21/23 1442  WBC 11.0*  HGB 7.9*  HCT 25.9*  MCV 86.3  PLT 262   Basic Metabolic Panel: Recent Labs  Lab 03/21/23 1442  NA 138  K 4.9  CL 109  CO2 23  GLUCOSE 112*  BUN 34*  CREATININE 2.44*  CALCIUM 8.3*   GFR: Estimated Creatinine Clearance: 20.1 mL/min (A) (by C-G formula based on SCr of 2.44 mg/dL (H)). Liver Function Tests: Recent Labs  Lab 03/21/23 1442  AST 14*  ALT 9  ALKPHOS 82  BILITOT 0.5  PROT 6.7  ALBUMIN 3.7   Recent Labs  Lab 03/21/23 1442  LIPASE 28   No results for input(s): "AMMONIA" in the last 168 hours. Coagulation Profile: No results for input(s): "INR", "PROTIME" in the last 168  hours. Cardiac Enzymes: No results for input(s): "CKTOTAL", "CKMB", "CKMBINDEX", "TROPONINI" in the last 168 hours. BNP (last 3 results) No results for input(s): "PROBNP" in the last 8760 hours. HbA1C: No results for input(s): "HGBA1C" in the last 72 hours. CBG: No results for input(s): "GLUCAP" in the last 168 hours. Lipid Profile: No results for input(s): "CHOL", "HDL", "LDLCALC", "TRIG", "CHOLHDL", "LDLDIRECT" in the last 72 hours. Thyroid Function Tests: No results for input(s): "TSH", "T4TOTAL", "FREET4", "T3FREE", "THYROIDAB" in the last 72 hours. Anemia Panel: No results for input(s): "VITAMINB12", "FOLATE", "FERRITIN", "TIBC", "IRON", "RETICCTPCT" in the last 72 hours. Urine analysis:    Component Value Date/Time   COLORURINE YELLOW (A) 11/07/2021 1047   APPEARANCEUR CLEAR (A) 11/07/2021 1047   APPEARANCEUR Clear 09/21/2014 1003   LABSPEC 1.012 11/07/2021 1047   LABSPEC 1.004 09/21/2014 1003   PHURINE 5.0 11/07/2021 1047   GLUCOSEU NEGATIVE 11/07/2021 1047   GLUCOSEU Negative 09/21/2014 1003   HGBUR NEGATIVE 11/07/2021 1047   BILIRUBINUR NEGATIVE 11/07/2021 1047   BILIRUBINUR Negative 09/21/2014 1003   KETONESUR NEGATIVE 11/07/2021 1047   PROTEINUR NEGATIVE 11/07/2021 1047   NITRITE NEGATIVE 11/07/2021 1047   LEUKOCYTESUR NEGATIVE 11/07/2021 1047   LEUKOCYTESUR Negative 09/21/2014 1003    Radiological Exams on Admission: CT ABDOMEN PELVIS WO CONTRAST  Result Date: 03/21/2023 CLINICAL DATA:  Lower GI bleed. EXAM: CT ABDOMEN AND PELVIS WITHOUT CONTRAST TECHNIQUE: Multidetector CT imaging of the abdomen and pelvis was performed following the standard protocol without IV contrast. RADIATION DOSE REDUCTION: This exam was performed according to the departmental dose-optimization program which includes automated exposure control, adjustment of the mA and/or kV according to patient size and/or use of iterative reconstruction technique. COMPARISON:  Two-view chest x-ray  02/20/2017. CT of the chest without contrast 06/04/2012. FINDINGS: Lower chest: An ill-defined nodular density in the medial right lower lobe may be partial volume of the hila. Mild dependent atelectasis is present. The lungs are otherwise clear Hepatobiliary: No focal liver abnormality is seen. No gallstones, gallbladder wall thickening, or biliary dilatation. Pancreas: Scattered calcifications are present throughout the pancreas. No acute inflammatory  changes present. Spleen: Normal in size without focal abnormality. Adrenals/Urinary Tract: Adrenal glands are normal bilaterally. Kidneys are unremarkable. No stone or obstruction is present. The ureters are normal. The urinary bladder is unremarkable. Stomach/Bowel: Stomach and duodenum are within normal limits. The small bowel is unremarkable. The terminal vision scratched at the terminal ileum is visualized and normal. The appendix is normal. The ascending and transverse colon are within normal limits. Descending and sigmoid colon are normal. Vascular/Lymphatic: Atherosclerotic calcifications are present within the cavernous internal carotid arteries. No aneurysm is present. No significant adenopathy is present. Reproductive: Uterus and bilateral adnexa are unremarkable. Other: No abdominal wall hernia or abnormality. No abdominopelvic ascites. Musculoskeletal: The vertebral body heights and alignment normal. Bony pelvis is within limits. No acute or focal osseous abnormalities are present. The hips are visualized normal bilaterally. IMPRESSION: 1. No acute or focal lesion to explain the patient's lower GI bleed. 2. Scattered calcifications throughout the pancreas compatible with chronic pancreatitis. No acute inflammatory changes present. 3. Ill-defined nodular density in the medial right lower lobe may be partial volume of the hila. Electronically Signed   By: Marin Roberts M.D.   On: 03/21/2023 16:18     Data Reviewed: Relevant notes from primary  care and specialist visits, past discharge summaries as available in EHR, including Care Everywhere. Prior diagnostic testing as pertinent to current admission diagnoses Updated medications and problem lists for reconciliation ED course, including vitals, labs, imaging, treatment and response to treatment Triage notes, nursing and pharmacy notes and ED provider's notes Notable results as noted in HPI   Assessment and Plan: * Anemia due to gastrointestinal blood loss, suspect chronic Symptomatic anemia/melena Patient with a presyncopal episode, hypotensive with EMS, hemoglobin 7.9, down from 9.7 on 1/24 with positive stool guaiac Daughter reports dark tarry stool since December 2023 CT abdomen pelvis without contrast was nonacute Seen by gastroenterologist, Dr. Tobi Bastos believes anemia might be more chronic N.p.o. for EGD Continue Protonix Received 2 units emergency release PRBCs in the ED Hb 7.9>10.8 Serial H&H and transfuse further if needed   Syncope Suspect multifactorial related to symptomatic anemia, large bowel movement with hypotension, acute kidney injury (recent diarrhea/on diuretics/antihypertensives), recently started medication (Depakote and Seroquel).  Lower suspicion for symptomatic bradycardia Received 1 L NS bolus and 2 units emergency release PRBCs in the ED Treat other potential etiologies as outlined under respective problems Continuous cardiac monitoring Neurologic checks Echocardiogram.  Patient had an upcoming 1 with cardiology  Hypotension History of primary hypertension Suspect secondary to medication, dehydration Hold home antihypertensives of Coreg lisinopril, spironolactone and furosemide  BP was fluid responsive   Acute renal failure superimposed on stage 3a chronic kidney disease (HCC) Suspect prerenal secondary to diarrhea couple weeks prior, in combination with diuretics of spironolactone as well as furosemide as needed Creatinine 2.44 up from 1.2 on  02/12/2023 Received a 1 L bolus in the ED Continue to monitor Encourage oral hydration.  Daughter says patient picks at her food  History of Clostridioides difficile infection 2017 History of diarrhea 2 weeks prior, tested negative for C. difficile Will do stool studies if develops diarrhea otherwise infectious diarrhea not suspected  Chronic systolic CHF (congestive heart failure), NYHA class 3 (HCC) Cardiomyopathy s/p AICD Bradycardia of 43  No acute issues suspected Pulse recorded as 43 on 1 occasion in the ED so will interrogate AICD Patient saw her cardiologist 02/26/2023 Last EF 07/2021 was 30 to 35% Holding Coreg and lisinopril due to hypotension Monitor for fluid overload/CHF  exacerbation in view of IV fluid bolus and 2 units of blood Daily weights with intake and output monitoring  Dementia (HCC) Patient with moderate dementia, incontinent of stool and urine at baseline and needs full-time care Currently on bupropion, Exelon, quetiapine and recently started Depakote Will continue Exelon and bupropion.  Hold off on recently started Depakote given possible contribution to etiology and reintroduce as appropriate.   Haldol as needed IV Delirium precautions: -Monitor and correct any metabolic abnormalities, infections and electrolyte imbalances which can contribute to altered mental status. -Minimize use of narcotics, benzodiazepines, anti cholingeneric, and antihistamine medications as these drug classes can aggravate delirium -Patient would benefit from staying awake during the daylight hours. Lights off at nighttime to facilitate sleep better      DVT prophylaxis: SCD  Consults: GI, Dr Tobi Bastos   Advance Care Planning:   Code Status: DNR   Family Communication: daughter Encarnacion Slates  Disposition Plan: Back to previous home environment  Severity of Illness: The appropriate patient status for this patient is INPATIENT. Inpatient status is judged to be reasonable and necessary in  order to provide the required intensity of service to ensure the patient's safety. The patient's presenting symptoms, physical exam findings, and initial radiographic and laboratory data in the context of their chronic comorbidities is felt to place them at high risk for further clinical deterioration. Furthermore, it is not anticipated that the patient will be medically stable for discharge from the hospital within 2 midnights of admission.   * I certify that at the point of admission it is my clinical judgment that the patient will require inpatient hospital care spanning beyond 2 midnights from the point of admission due to high intensity of service, high risk for further deterioration and high frequency of surveillance required.*  Author: Andris Baumann, MD 03/21/2023 7:05 PM  For on call review [email or web address].

## 2023-03-21 NOTE — Assessment & Plan Note (Addendum)
Cardiomyopathy s/p AICD Bradycardia of 43  No acute issues suspected Pulse recorded as 43 on 1 occasion in the ED so will interrogate AICD Patient saw her cardiologist 02/26/2023 Last EF 07/2021 was 30 to 35% Holding Coreg and lisinopril due to hypotension Monitor for fluid overload/CHF exacerbation in view of IV fluid bolus and 2 units of blood Daily weights with intake and output monitoring

## 2023-03-21 NOTE — Assessment & Plan Note (Addendum)
History of primary hypertension Suspect secondary to medication, dehydration Hold home antihypertensives of Coreg lisinopril, spironolactone and furosemide  BP was fluid responsive

## 2023-03-21 NOTE — Assessment & Plan Note (Deleted)
Suspect multifactorial related to symptomatic anemia, large bowel movement with hypotension, acute kidney injury, recently started medication Received 1 L NS bolus and 2 units emergency release PRBCs in the ED Treat other potential etiologies as outlined under respective problems Continuous cardiac monitoring Neurologic checks

## 2023-03-21 NOTE — ED Triage Notes (Signed)
Patient states she was going for a walk and became dizzy, hypotensive with EMS; incontinent of bowels upon arrival

## 2023-03-21 NOTE — Assessment & Plan Note (Addendum)
Suspect prerenal secondary to diarrhea couple weeks prior, in combination with diuretics of spironolactone as well as furosemide as needed Creatinine 2.44 up from 1.2 on 02/12/2023 Received a 1 L bolus in the ED Continue to monitor Encourage oral hydration.  Daughter says patient picks at her food

## 2023-03-21 NOTE — Assessment & Plan Note (Addendum)
Suspect multifactorial related to symptomatic anemia, large bowel movement with hypotension, acute kidney injury (recent diarrhea/on diuretics/antihypertensives), recently started medication (Depakote and Seroquel).  Lower suspicion for symptomatic bradycardia Received 1 L NS bolus and 2 units emergency release PRBCs in the ED Treat other potential etiologies as outlined under respective problems Continuous cardiac monitoring Neurologic checks Echocardiogram.  Patient had an upcoming 1 with cardiology

## 2023-03-21 NOTE — IPAL (Signed)
  Interdisciplinary Goals of Care Family Meeting   Date carried out: 03/21/2023  Location of the meeting: Phone conference  Member's involved: Physician and Family Member or next of kin Daughter Katrina Rose  Durable Power of Attorney or acting medical decision maker: Katrina Rose    Discussion: We discussed goals of care for Katrina Rose .   I have reviewed medical records including EPIC notes, labs and imaging, assessed the patient and then met with daughter to discuss major active diagnoses, plan of care, natural trajectory, prognosis, GOC, EOL wishes, disposition and options including Full code/DNI/DNR and the concept of comfort care if DNR is elected. Questions and concerns were addressed. They are in agreement to continue current plan of care . Election for DNR status.   Code status:   Code Status: DNR   Disposition: Continue current acute care  Time spent for the meeting: 30    Andris Baumann, MD  03/21/2023, 8:41 PM

## 2023-03-22 ENCOUNTER — Encounter
Admission: EM | Disposition: A | Discharge status: Skilled Nursing Facility | Payer: Self-pay | Source: Home / Self Care | Attending: Internal Medicine

## 2023-03-22 ENCOUNTER — Inpatient Hospital Stay: Payer: Medicare HMO | Admitting: Certified Registered"

## 2023-03-22 ENCOUNTER — Encounter: Payer: Self-pay | Admitting: Internal Medicine

## 2023-03-22 ENCOUNTER — Inpatient Hospital Stay (HOSPITAL_COMMUNITY)
Admit: 2023-03-22 | Discharge: 2023-03-22 | Disposition: A | Discharge status: Home / Self Care | Payer: Medicare HMO | Attending: Internal Medicine | Admitting: Internal Medicine

## 2023-03-22 DIAGNOSIS — N179 Acute kidney failure, unspecified: Secondary | ICD-10-CM | POA: Diagnosis not present

## 2023-03-22 DIAGNOSIS — F03918 Unspecified dementia, unspecified severity, with other behavioral disturbance: Secondary | ICD-10-CM | POA: Diagnosis not present

## 2023-03-22 DIAGNOSIS — I5022 Chronic systolic (congestive) heart failure: Secondary | ICD-10-CM | POA: Diagnosis not present

## 2023-03-22 DIAGNOSIS — D649 Anemia, unspecified: Secondary | ICD-10-CM | POA: Diagnosis not present

## 2023-03-22 DIAGNOSIS — K922 Gastrointestinal hemorrhage, unspecified: Secondary | ICD-10-CM | POA: Diagnosis not present

## 2023-03-22 DIAGNOSIS — R55 Syncope and collapse: Secondary | ICD-10-CM | POA: Diagnosis not present

## 2023-03-22 DIAGNOSIS — Z7189 Other specified counseling: Secondary | ICD-10-CM

## 2023-03-22 DIAGNOSIS — Z515 Encounter for palliative care: Secondary | ICD-10-CM

## 2023-03-22 DIAGNOSIS — R578 Other shock: Secondary | ICD-10-CM | POA: Diagnosis not present

## 2023-03-22 DIAGNOSIS — E872 Acidosis, unspecified: Secondary | ICD-10-CM | POA: Insufficient documentation

## 2023-03-22 DIAGNOSIS — D500 Iron deficiency anemia secondary to blood loss (chronic): Secondary | ICD-10-CM | POA: Diagnosis not present

## 2023-03-22 HISTORY — PX: ESOPHAGOGASTRODUODENOSCOPY (EGD) WITH PROPOFOL: SHX5813

## 2023-03-22 LAB — TYPE AND SCREEN
Unit division: 0
Unit division: 0
Unit division: 0
Unit division: 0

## 2023-03-22 LAB — BASIC METABOLIC PANEL
Anion gap: 7 (ref 5–15)
BUN: 31 mg/dL — ABNORMAL HIGH (ref 8–23)
CO2: 20 mmol/L — ABNORMAL LOW (ref 22–32)
Calcium: 9 mg/dL (ref 8.9–10.3)
Chloride: 112 mmol/L — ABNORMAL HIGH (ref 98–111)
Creatinine, Ser: 1.95 mg/dL — ABNORMAL HIGH (ref 0.44–1.00)
GFR, Estimated: 29 mL/min — ABNORMAL LOW (ref >60)
Glucose, Bld: 80 mg/dL (ref 70–99)
Potassium: 4.5 mmol/L (ref 3.5–5.1)
Sodium: 139 mmol/L (ref 135–145)

## 2023-03-22 LAB — BPAM RBC
Blood Product Expiration Date: 202405242359
Blood Product Expiration Date: 202405312359
Blood Product Expiration Date: 202406012359
Blood Product Expiration Date: 202406112359
Blood Product Expiration Date: 202406112359
Unit Type and Rh: 5100
Unit Type and Rh: 5100
Unit Type and Rh: 600
Unit Type and Rh: 600

## 2023-03-22 LAB — HEMOGLOBIN
Hemoglobin: 10 g/dL — ABNORMAL LOW (ref 12.0–15.0)
Hemoglobin: 10.3 g/dL — ABNORMAL LOW (ref 12.0–15.0)
Hemoglobin: 11.5 g/dL — ABNORMAL LOW (ref 12.0–15.0)

## 2023-03-22 LAB — ECHOCARDIOGRAM COMPLETE: Height: 61 in

## 2023-03-22 LAB — HIV ANTIBODY (ROUTINE TESTING W REFLEX): HIV Screen 4th Generation wRfx: NONREACTIVE

## 2023-03-22 LAB — PREPARE RBC (CROSSMATCH)

## 2023-03-22 SURGERY — ESOPHAGOGASTRODUODENOSCOPY (EGD) WITH PROPOFOL
Anesthesia: General

## 2023-03-22 MED ORDER — PROPOFOL 500 MG/50ML IV EMUL
INTRAVENOUS | Status: DC | PRN
  Administered 2023-03-22: 20 mg via INTRAVENOUS
  Administered 2023-03-22: 80 mg via INTRAVENOUS

## 2023-03-22 MED ORDER — DIVALPROEX SODIUM 250 MG PO DR TAB: 250.0000 mg | DELAYED_RELEASE_TABLET | Freq: Every day | ORAL | Status: DC

## 2023-03-22 MED ORDER — CARVEDILOL 3.125 MG PO TABS
3.1250 mg | ORAL_TABLET | Freq: Two times a day (BID) | ORAL | Status: DC
  Administered 2023-03-23 – 2023-03-24 (×2): 3.125 mg via ORAL
  Filled 2023-03-22 (×2): qty 1

## 2023-03-22 MED ORDER — SODIUM CHLORIDE 0.9 % IV SOLN: INTRAVENOUS | Status: DC

## 2023-03-22 MED ORDER — LIDOCAINE HCL (CARDIAC) PF 100 MG/5ML IV SOSY
PREFILLED_SYRINGE | INTRAVENOUS | Status: DC | PRN
  Administered 2023-03-22: 100 mg via INTRAVENOUS

## 2023-03-22 MED ORDER — QUETIAPINE FUMARATE 25 MG PO TABS
100.0000 mg | ORAL_TABLET | Freq: Every day | ORAL | Status: DC
  Administered 2023-03-22 – 2023-03-26 (×5): 100 mg via ORAL
  Filled 2023-03-22 (×5): qty 4

## 2023-03-22 MED ORDER — CARVEDILOL 6.25 MG PO TABS: 12.5000 mg | ORAL_TABLET | Freq: Two times a day (BID) | ORAL | Status: DC

## 2023-03-22 MED ORDER — LACTATED RINGERS IV SOLN: INTRAVENOUS | Status: DC

## 2023-03-22 MED ORDER — QUETIAPINE FUMARATE 25 MG PO TABS: 50.0000 mg | ORAL_TABLET | Freq: Every day | ORAL | Status: DC

## 2023-03-22 MED ORDER — LACTATED RINGERS IV SOLN: INTRAVENOUS | Status: DC | PRN

## 2023-03-22 NOTE — Anesthesia Preprocedure Evaluation (Signed)
Anesthesia Evaluation  Patient identified by MRN, date of birth, ID band Patient awake    Reviewed: Allergy & Precautions, H&P , NPO status , Patient's Chart, lab work & pertinent test results, reviewed documented beta blocker date and time   Airway Mallampati: II   Neck ROM: full    Dental  (+) Upper Dentures, Lower Dentures   Pulmonary neg pulmonary ROS, Current Smoker and Patient abstained from smoking.   Pulmonary exam normal        Cardiovascular Exercise Tolerance: Poor hypertension, On Medications +CHF  Normal cardiovascular exam+ dysrhythmias + Cardiac Defibrillator  Rhythm:regular Rate:Normal     Neuro/Psych negative neurological ROS  negative psych ROS   GI/Hepatic Neg liver ROS,GERD  Medicated,,  Endo/Other  negative endocrine ROS    Renal/GU negative Renal ROS  negative genitourinary   Musculoskeletal   Abdominal   Peds  Hematology  (+) Blood dyscrasia, anemia   Anesthesia Other Findings Past Medical History: No date: AICD (automatic cardioverter/defibrillator) present No date: CHF (congestive heart failure) (HCC) No date: GERD (gastroesophageal reflux disease) No date: Hypertension No date: IDA (iron deficiency anemia) No date: Insomnia No date: LBBB (left bundle branch block) Past Surgical History: No date: ANKLE SURGERY; Left No date: CARDIAC CATHETERIZATION No date: COLONOSCOPY No date: debridement and closure sternal wound No date: DILATION AND CURETTAGE OF UTERUS No date: ESOPHAGOGASTRODUODENOSCOPY 08/01/2021: ICD GENERATOR CHANGEOUT; N/A     Comment:  Procedure: ICD GENERATOR CHANGEOUT;  Surgeon: Marcina Millard, MD;  Location: [facility] INVASIVE CV LAB;  Service:              Cardiovascular;  Laterality: N/A; 2010: pacermaker/ defibrilater  No date: TUBAL LIGATION BMI    Body Mass Index: 26.64 kg/m     Reproductive/Obstetrics negative OB ROS                              Anesthesia Physical Anesthesia Plan  ASA: 3  Anesthesia Plan: General   Post-op Pain Management:    Induction: Intravenous  PONV Risk Score and Plan: 2 and Propofol infusion, TIVA and Treatment may vary due to age or medical condition  Airway Management Planned: Natural Airway and Nasal Cannula  Additional Equipment:   Intra-op Plan:   Post-operative Plan:   Informed Consent: I have reviewed the patients History and Physical, chart, labs and discussed the procedure including the risks, benefits and alternatives for the proposed anesthesia with the patient or authorized representative who has indicated his/her understanding and acceptance.     Dental Advisory Given  Plan Discussed with: CRNA  Anesthesia Plan Comments:         Anesthesia Quick Evaluation

## 2023-03-22 NOTE — Op Note (Signed)
[facility] Gastroenterology Patient Name: Katrina Rose Procedure Date: 03/22/2023 2:25 PM MRN: [id number] Account #: [id number] Date of Birth: December 26, 1960 Admit Type: Inpatient Age: 62 Room: [facility] ENDO ROOM 4 Gender: Female Note Status: Finalized Instrument Name: Upper Endoscope [id number] Procedure:             Upper GI endoscopy Indications:           Iron deficiency anemia Providers:             [name] MD, MD Referring MD:          Hassell Halim MD (Referring MD) Medicines:             Monitored Anesthesia Care Complications:         No immediate complications. Procedure:             Pre-Anesthesia Assessment:                        - Prior to the procedure, a History and Physical was                         performed, and patient medications, allergies and                         sensitivities were reviewed. The patient's tolerance                         of previous anesthesia was reviewed.                        - The risks and benefits of the procedure and the                         sedation options and risks were discussed with the                         patient. All questions were answered and informed                         consent was obtained.                        - ASA Grade Assessment: II - A patient with mild                         systemic disease.                        After obtaining informed consent, the endoscope was                         passed under direct vision. Throughout the procedure,                         the patient's blood pressure, pulse, and oxygen                         saturations were monitored continuously. The Endoscope  was introduced through the mouth, and advanced to the                         third part of duodenum. The upper GI endoscopy was                         accomplished with ease. The patient tolerated the                         procedure well. Findings:      The  esophagus was normal.      The stomach was normal.      The examined duodenum was normal. Impression:            - Normal esophagus.                        - Normal stomach.                        - Normal examined duodenum.                        - No specimens collected. Recommendation:        - Return patient to hospital ward for ongoing care.                        - Advance diet as tolerated.                        - Continue present medications.                        - I do not believe she can do a bowel prep for a                         colonoscopy , discussed with family and they are aware                         of the limitations and agree Procedure Code(s):     --- Professional ---                        [phone], Esophagogastroduodenoscopy, flexible,                         transoral; diagnostic, including collection of                         specimen(s) by brushing or washing, when performed                         (separate procedure) Diagnosis Code(s):     --- Professional ---                        D50.9, Iron deficiency anemia, unspecified CPT copyright 2022 American Medical Association. All rights reserved. The codes documented in this report are preliminary and upon coder review may  be revised to meet current compliance requirements. [name], MD [name] MD, MD 03/22/2023 3:15:02 PM This report has been signed electronically. Number of  Addenda: 0 Note Initiated On: 03/22/2023 2:25 PM Estimated Blood Loss:  Estimated blood loss: none.      [facility]

## 2023-03-22 NOTE — Hospital Course (Signed)
Katrina Rose is a 62 y.o. female with medical history significant for hypertension, early onset dementia, chronic systolic CHF (EF 30 to 35% 07/2021) , LBBB, cardiomyopathy s/p implantable ICD recently doing well from a cardiovascular standpoint when last seen by her cardiologist on 02/26/2023 who presents to the ED with a syncopal episode.  Patient also had a recent diarrhea and tarry stools. Upon arriving the emergency room, hemoglobin was 7.9, received 2 units of PRBC. Patient also seen by GI, placed on PPI, EGD planned.

## 2023-03-22 NOTE — TOC Initial Note (Signed)
Transition of Care [facility]) - Initial/Assessment Note    Patient Details  Name: Katrina Rose MRN: [id number] Date of Birth: May 29, 1961  [facility]) CM/SW Contact:    Carmina Miller, LCSWA Phone Number: 03/22/2023, 12:49 PM  Clinical Narrative:                  CSW received consult for placement options. CSW spoke with pt's daughter Encarnacion Slates and pt's sister Phil Dopp [facility]), they states pt is not safe to dc home, she lives alone and can no longer care for herself. Selena states there are cameras all over pt's home but pt continues to decline and can no longer be in that environment. Phil Dopp states she lives in [place] is local but is not able to care for pt either. Selena states pt has been approved for long term care Medicaid and is at the top of the waiting list for [facility]  and has been in contact with [facility] for their new memory care unit but there are no beds available and a possible issue with accreditation (?). Selena states the contact for [facility] is Amalia Hailey ([phone]) and the contact for [facility] is Tiffany [phone]). Selena states pt's DSS Caseworker is Mickeal Needy ([phone]). Selena states she will continue to work with [facility] and DSS on placement for pt but states they will not be able to take pt home.        Patient Goals and CMS Choice            Expected Discharge Plan and Services                                              Prior Living Arrangements/Services                       Activities of Daily Living      Permission Sought/Granted                  Emotional Assessment              Admission diagnosis:  Symptomatic anemia [D64.9] Patient Active Problem List   Diagnosis Date Noted   Metabolic acidosis 03/22/2023   Symptomatic anemia 03/21/2023   Anemia due to gastrointestinal blood loss, suspect chronic 03/21/2023   Dementia with behavioral disturbance (HCC)  03/21/2023   Hypertension 03/21/2023   Acute renal failure superimposed on stage 3a chronic kidney disease (HCC) 03/21/2023   Hypotension 03/21/2023   IDA (iron deficiency anemia) 03/21/2023   AICD (automatic cardioverter/defibrillator) present 03/21/2023   LBBB (left bundle branch block) 03/21/2023   History of Clostridioides difficile infection 2017 03/21/2023   Syncope 03/21/2023   DAT (dementia of Alzheimer type) (HCC) 07/30/2022   C. difficile diarrhea 07/20/2016   Chronic systolic CHF (congestive heart failure), NYHA class 3 (HCC) 09/07/2014   Cardiomyopathy (HCC) 04/06/2014   PCP:  Kandyce Rud, MD Pharmacy:   [facility] [address] [place] - [address] [place] [address] Phone: [phone] Fax: [phone]  [facility] - [place], [place] - [address] [place] [address] Phone: [phone] Fax: [phone]     Social Determinants of Health (SDOH) Social History: SDOH Screenings   Tobacco Use: High Risk (03/21/2023)   SDOH Interventions:  Readmission Risk Interventions     No data to display

## 2023-03-22 NOTE — ED Notes (Signed)
Advised nurse that patient has ready bed 

## 2023-03-22 NOTE — H&P (Addendum)
[name], MD [address], [place], [place], [address] [address], [place], [place], [address] Phone: [phone]  Fax: [phone]  Primary Care Physician:  Kandyce Rud, MD   Pre-Procedure History & Physical: HPI:  Katrina Rose is a 62 y.o. female is here for an endoscopy    Past Medical History:  Diagnosis Date   AICD (automatic cardioverter/defibrillator) present    CHF (congestive heart failure) (HCC)    Dementia (HCC)    GERD (gastroesophageal reflux disease)    Hypertension    IDA (iron deficiency anemia)    Insomnia    LBBB (left bundle branch block)     Past Surgical History:  Procedure Laterality Date   ANKLE SURGERY Left    CARDIAC CATHETERIZATION     COLONOSCOPY     COLONOSCOPY WITH PROPOFOL N/A 10/07/2021   Procedure: COLONOSCOPY WITH PROPOFOL;  Surgeon: Jaynie Collins, DO;  Location[facility] ENDOSCOPY;  Service: Gastroenterology;  Laterality: N/A;   debridement and closure sternal wound     DILATION AND CURETTAGE OF UTERUS     ESOPHAGOGASTRODUODENOSCOPY     ESOPHAGOGASTRODUODENOSCOPY (EGD) WITH PROPOFOL N/A 10/07/2021   Procedure: ESOPHAGOGASTRODUODENOSCOPY (EGD) WITH PROPOFOL;  Surgeon: Jaynie Collins, DO;  Location: [facility] ENDOSCOPY;  Service: Gastroenterology;  Laterality: N/A;   ICD GENERATOR CHANGEOUT N/A 08/01/2021   Procedure: ICD GENERATOR CHANGEOUT;  Surgeon: Marcina Millard, MD;  Location: [facility] INVASIVE CV LAB;  Service: Cardiovascular;  Laterality: N/A;   pacermaker/ defibrilater   2010   TUBAL LIGATION      Prior to Admission medications   Medication Sig Start Date End Date Taking? Authorizing Provider  aspirin EC 81 MG tablet Take 81 mg by mouth daily.   Yes [provider]  atorvastatin (LIPITOR) 20 MG tablet Take 20 mg by mouth daily.   Yes [provider]  benzonatate (TESSALON) 200 MG capsule Take 200 mg by mouth 3 (three) times daily as needed. 10/22/22  Yes [provider]  buPROPion (WELLBUTRIN XL) 150 MG 24 hr tablet Take 150 mg by mouth daily.   Yes [provider]  carvedilol (COREG) 12.5 MG tablet Take 12.5 mg by mouth 2 (two) times daily with a meal.   Yes [provider]  divalproex (DEPAKOTE) 250 MG DR tablet Take 250 mg by mouth daily. 03/16/23 04/15/23 Yes [provider]  famotidine (PEPCID) 40 MG tablet Take 1 tablet by mouth at bedtime. 04/07/22 04/07/23 Yes [provider]  ferrous sulfate 325 (65 FE) MG tablet Take 325 mg by mouth daily.   Yes [provider]  furosemide (LASIX) 20 MG tablet Take 20 mg by mouth daily. 01/13/17  Yes [provider]  lisinopril (ZESTRIL) 5 MG tablet Take 5 mg by mouth in the morning and at bedtime.   Yes [provider]  magnesium oxide (MAG-OX) 400 (240 Mg) MG tablet Take 400 mg by mouth daily.   Yes [provider]  pantoprazole (PROTONIX) 40 MG tablet Take 40 mg by mouth daily.   Yes [provider]  Probiotic Product (PROBIOTIC DAILY PO) Take 1 capsule by mouth daily.   Yes [provider]  QUEtiapine (SEROQUEL) 50 MG tablet Take 100 tablets by mouth at bedtime. 01/12/23  Yes [provider]  rivastigmine (EXELON) 3 MG capsule Take by mouth. 06/26/22 06/21/23 Yes [provider]  spironolactone (ALDACTONE) 25 MG tablet Take 25 mg by mouth daily.   Yes [provider]  cephALEXin (KEFLEX) 250 MG capsule Take 2 capsules (500 mg total) by mouth 2 (two) times daily. Patient not taking: Reported on 03/21/2023 08/01/21   Marcina Millard, MD    Allergies as of 03/21/2023 - Review Complete 03/21/2023  Allergen Reaction Noted   Sulfa antibiotics Hives 07/20/2016    Family History  Problem Relation Age of Onset   CAD Mother    Colon cancer Father     Social History   Socioeconomic History   Marital status: Single    Spouse name: Not on file   Number of children: Not on file   Years of  education: Not on file   Highest education level: Not on file  Occupational History   Not on file  Tobacco Use   Smoking status: Some Days    Types: Cigarettes   Smokeless tobacco: Never   Tobacco comments:    occasional  Substance and Sexual Activity   Alcohol use: Yes    Comment: rarely   Drug use: No   Sexual activity: Not on file  Other Topics Concern   Not on file  Social History Narrative   Not on file   Social Determinants of Health   Financial Resource Strain: Not on file  Food Insecurity: Not on file  Transportation Needs: Not on file  Physical Activity: Not on file  Stress: Not on file  Social Connections: Not on file  Intimate Partner Violence: Not on file    Review of Systems: See HPI, otherwise negative ROS  Physical Exam: BP 123/72   Pulse 72   Temp (!) 97.4 F (36.3 C) (Temporal)   Resp 20   Ht 5\' 1"  (1.549 m)   Wt 61.6 kg   SpO2 96%   BMI 25.66 kg/m  General:   Alert,  pleasant and cooperative in NAD Head:  Normocephalic and atraumatic. Neck:  Supple; no masses or thyromegaly. Lungs:  Clear throughout to auscultation, normal respiratory effort.    Heart:  +S1, +S2, Regular rate and rhythm, No edema. Abdomen:  Soft, nontender and nondistended. Normal bowel sounds, without guarding, and without rebound.   Neurologic:  Alert and  oriented x4;  grossly normal neurologically.  Impression/Plan: [name] is here for an endoscopy  to be performed for  evaluation of gi bleed    Risks, benefits, limitations, and alternatives regarding endoscopy have been reviewed with the patient/family (memory impairement).  Questions have been answered.  All parties agreeable.   [name], MD  03/22/2023, 3:01 PM

## 2023-03-22 NOTE — Transfer of Care (Signed)
Immediate Anesthesia Transfer of Care Note  Patient: Katrina Rose  Procedure(s) Performed: ESOPHAGOGASTRODUODENOSCOPY (EGD) WITH PROPOFOL  Patient Location: Endoscopy Unit  Anesthesia Type:MAC  Level of Consciousness: drowsy  Airway & Oxygen Therapy: Patient Spontanous Breathing  Post-op Assessment: Report given to RN  Post vital signs: Reviewed and stable  Last Vitals:  Vitals Value Taken Time  BP 115/71   Temp 98.5   Pulse 72   Resp 18   SpO2 96     Last Pain:  Vitals:   03/22/23 1449  TempSrc: Temporal  PainSc:          Complications: No notable events documented.

## 2023-03-22 NOTE — Consult Note (Signed)
Consultation Note Date: 03/22/2023   Patient Name: Katrina Rose  DOB: 1961/02/23  MRN: [id number]  Age / Sex: 62 y.o., female  PCP: Kandyce Rud, MD Referring Physician: Andris Baumann, MD  Reason for Consultation: Establishing goals of care   HPI/Brief Hospital Course: 62 y.o. female  with past medical history of HTN, early onset dementia (Alzheimer's versus frontotemporal) chronic systolic heart failure, cardiomyopathy s/p implantable ICD admitted on 03/21/2023 with syncopal episode. Family provided history of dark tarry stools for several months.  On arrival to ED found to be anemic-hgb 7.9, received 2 units PRBC   GI consulted, prepped and taken for EGD 5/19  Palliative medicine was consulted for assisting with goals of care conversations  Subjective:  Extensive chart review has been completed prior to meeting patient including labs, vital signs, imaging, progress notes, orders, and available advanced directive documents from current and previous encounters.  Visited with Katrina Rose at her bedside. Daughter-Katrina Rose, sister-Katrina Rose and neighbor present at bedside during time of visit.  Introduced myself as a Publishing rights manager as a member of the palliative care team. Explained palliative medicine is specialized medical care for people living with serious illness. It focuses on providing relief from the symptoms and stress of a serious illness. The goal is to improve quality of life for both the patient and the family.   Katrina Rose sitting in bed eating dinner, awake and alert, pleasantly confused and unable to participate in GOC conversations.  Family able to provide brief life review. 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 Not currently married, has two children, Katrina Rose and a son. Sister-Katrina Rose appointed at [employer]. Katrina Rose diagnosed with dementia about 2 years ago but family mentions signs of memory loss well before initial  diagnosis. Katrina Rose still lives alone but family has multiple cameras around the home and neighbors visit multiple times throughout day. She is able to ambulate without assistance but unable to perform ADL's. She is incontinent of bowel and bladder. Family has noticed a decline in overall function and mentation over the last several months.  Family shares their frustration with struggles of having Katrina Rose placed in LTC and/or memory care unit. Family shares she is a pending Medicaid application. Family shares they feel Katrina Rose is unsafe to return home and is looking into LTC placement, they are hopeful this admission will open better opportunities.  Confirmed DNR/DNI. Family shares they wish to avoid prolonging suffering or contributing more to more harm. Discussed overall philosophy of hospice services, focus on providing comfort and improving quality of life. Family expresses interest in learning more throughout hospitalization. Shared Katrina Rose is able to discharge to LTC with hospice services following if family desires. We also reviewed a MOST form, copies provided to family for review. To be completed after family discussions.  Answered and addressed all questions and concerns. Emotional support provided to family. PMT to continue to follow for ongoing needs and support.  I discussed importance of continued conversations with family/support persons and all members of their medical team regarding overall plan of care and treatment options ensuring decisions are in alignment with patients goals of care.  All questions/concerns addressed. Emotional support provided to patient/family/support persons. PMT will continue to follow and support patient as needed.   Objective: Primary Diagnoses: Present on Admission:  Symptomatic anemia  Chronic systolic CHF (congestive heart failure), NYHA class 3 (HCC)   Vital Signs: BP 127/62   Pulse (!) 58   Temp 98.7  F (37.1 C)   Resp 17   Ht  5\' 1"  (1.549 m)   Wt 61.6 kg   SpO2 100%   BMI 25.66 kg/m  Pain Scale: 0-10   Pain Score: 0-No pain   IO: Intake/output summary:  Intake/Output Summary (Last 24 hours) at 03/22/2023 1752 Last data filed at 03/22/2023 1516 Gross per 24 hour  Intake 100 ml  Output --  Net 100 ml    LBM: Last BM Date : 03/22/23 Baseline Weight: Weight: 61.6 kg Most recent weight: Weight: 61.6 kg       Palliative Assessment/Data: 50%   Assessment and Plan  SUMMARY OF RECOMMENDATIONS   DNR/DNI LTC placement-TOC assisiting Recommend MOST form completion Provided basic education on Hospice services, interested in learning more throughout hospitalization PMT continue to follow for ongoing needs and support  Thank you for this consult and allowing Palliative Medicine to participate in the care of Katrina Rose. Palliative medicine will continue to follow and assist as needed.   Time Total: 55 minutes  Time spent includes: Detailed review of medical records (labs, imaging, vital signs), medically appropriate exam (mental status, respiratory, cardiac, skin), discussed with treatment team, counseling and educating patient, family and staff, documenting clinical information, medication management and coordination of care.   Signed by: Leeanne Deed, DNP, AGNP-C Palliative Medicine    Please contact Palliative Medicine Team phone at [phone] for questions and concerns.  For individual provider: See Loretha Stapler

## 2023-03-22 NOTE — Anesthesia Postprocedure Evaluation (Signed)
Anesthesia Post Note  Patient: Katrina Rose  Procedure(s) Performed: ESOPHAGOGASTRODUODENOSCOPY (EGD) WITH PROPOFOL  Patient location during evaluation: Endoscopy Anesthesia Type: General Level of consciousness: awake and alert Pain management: pain level controlled Vital Signs Assessment: post-procedure vital signs reviewed and stable Respiratory status: spontaneous breathing, nonlabored ventilation, respiratory function stable and patient connected to nasal cannula oxygen Cardiovascular status: blood pressure returned to baseline and stable Postop Assessment: no apparent nausea or vomiting Anesthetic complications: no   No notable events documented.   Last Vitals:  Vitals:   03/22/23 1449 03/22/23 1517  BP: 123/72 (!) 107/55  Pulse: 72 72  Resp: 20 15  Temp: (!) 36.3 C (!) 35.9 C  SpO2: 96% 97%    Last Pain:  Vitals:   03/22/23 1517  TempSrc: Temporal  PainSc: Asleep                 Lenard Simmer

## 2023-03-22 NOTE — Progress Notes (Addendum)
Progress Note   Patient: Katrina Rose [id number]:[id number] DOB: May 15, 1961 DOA: 03/21/2023     1 DOS: the patient was seen and examined on 03/22/2023   Brief hospital course: Katrina Rose is a 62 y.o. female with medical history significant for hypertension, early onset dementia, chronic systolic CHF (EF 30 to 35% 07/2021) , LBBB, cardiomyopathy s/p implantable ICD recently doing well from a cardiovascular standpoint when last seen by her cardiologist on 02/26/2023 who presents to the ED with a syncopal episode.  Patient also had a recent diarrhea and tarry stools. Upon arriving the emergency room, hemoglobin was 7.9, received 2 units of PRBC. Patient also seen by GI, placed on PPI, EGD planned.   Principal Problem:   Anemia due to gastrointestinal blood loss, suspect chronic Active Problems:   Symptomatic anemia   Syncope   Hypotension   Acute renal failure superimposed on stage 3a chronic kidney disease (HCC)   Dementia with behavioral disturbance (HCC)   Hypertension   Chronic systolic CHF (congestive heart failure), NYHA class 3 (HCC)   Cardiomyopathy (HCC)   AICD (automatic cardioverter/defibrillator) present   LBBB (left bundle branch block)   History of Clostridioides difficile infection 2017   Assessment and Plan: * Anemia due to gastrointestinal blood loss, suspect chronic Symptomatic anemia/melena Patient received 2 units of PRBC, hemoglobin has improved.  Scheduled for EGD today by GI.  Continue PPI.   Syncope and collapse. Hypotension History of primary hypertension Appears secondary to dehydration with recent diarrhea.  Patient received blood transfusion and IV fluids, blood pressure has been stabilized.  Continue hold off blood pressure medicines.  Acute renal failure superimposed on stage 3a chronic kidney disease (HCC) Mild metabolic acidosis. Reviewed records from outside source, most recent creatinine was 1.2 in April, worsening renal function due to  dehydration and blood loss. Renal function still not at baseline, will start IV fluids with lactated Ringer's.  History of Clostridioides difficile infection 2017 Recent testing of C. difficile was negative.  Chronic systolic CHF (congestive heart failure), NYHA class 3 (HCC) Cardiomyopathy s/p AICD Bradycardia of 43  Patient is a placed on gentle rehydration for acute renal failure.  She does not have any volume overload.  Will monitor closely.  Restart Coreg at a reduced dose.  Dementia with behavioral disturbance (HCC) Restarted Seroquel.       Subjective:  Patient is very confused, did not sleep last night.  Physical Exam: Vitals:   03/22/23 0800 03/22/23 0900 03/22/23 0915 03/22/23 0930  BP: 116/60 108/83  (!) 122/59  Pulse:    (!) 58  Resp: 16 13 19 18   Temp:    97.6 F (36.4 C)  TempSrc:    Oral  SpO2: 98%   98%  Weight:      Height:       General exam: Appears calm and comfortable  Respiratory system: Clear to auscultation. Respiratory effort normal. Cardiovascular system: S1 & S2 heard, RRR. No JVD, murmurs, rubs, gallops or clicks. No pedal edema. Gastrointestinal system: Abdomen is nondistended, soft and nontender. No organomegaly or masses felt. Normal bowel sounds heard. Central nervous system: Alert and oriented x1. No focal neurological deficits. Extremities: Symmetric 5 x 5 power. Skin: No rashes, lesions or ulcers Psychiatry: Flat affect   Data Reviewed:  Reviewed CT scan results, lab results.  Family Communication: daughter updated  Disposition: Status is: Inpatient Remains inpatient appropriate because: Severity of disease, IV treatment, inpatient procedure.     Time spent: 50 minutes  Author: Marrion Coy, MD 03/22/2023 9:41 AM  For on call review [email or web address].

## 2023-03-23 ENCOUNTER — Encounter: Payer: Self-pay | Admitting: Gastroenterology

## 2023-03-23 DIAGNOSIS — I5022 Chronic systolic (congestive) heart failure: Secondary | ICD-10-CM

## 2023-03-23 DIAGNOSIS — K861 Other chronic pancreatitis: Secondary | ICD-10-CM | POA: Insufficient documentation

## 2023-03-23 DIAGNOSIS — N1831 Chronic kidney disease, stage 3a: Secondary | ICD-10-CM

## 2023-03-23 DIAGNOSIS — N171 Acute kidney failure with acute cortical necrosis: Secondary | ICD-10-CM | POA: Diagnosis not present

## 2023-03-23 DIAGNOSIS — D500 Iron deficiency anemia secondary to blood loss (chronic): Secondary | ICD-10-CM

## 2023-03-23 DIAGNOSIS — Z7189 Other specified counseling: Secondary | ICD-10-CM | POA: Diagnosis not present

## 2023-03-23 LAB — BASIC METABOLIC PANEL
Anion gap: 11 (ref 5–15)
BUN: 23 mg/dL (ref 8–23)
CO2: 21 mmol/L — ABNORMAL LOW (ref 22–32)
Calcium: 8.9 mg/dL (ref 8.9–10.3)
Chloride: 107 mmol/L (ref 98–111)
Creatinine, Ser: 1.31 mg/dL — ABNORMAL HIGH (ref 0.44–1.00)
GFR, Estimated: 46 mL/min — ABNORMAL LOW (ref >60)
Glucose, Bld: 82 mg/dL (ref 70–99)
Potassium: 4.2 mmol/L (ref 3.5–5.1)
Sodium: 139 mmol/L (ref 135–145)

## 2023-03-23 LAB — HEMOGLOBIN: Hemoglobin: 11.9 g/dL — ABNORMAL LOW (ref 12.0–15.0)

## 2023-03-23 LAB — ECHOCARDIOGRAM COMPLETE
AR max vel: 1.78 cm2
AV Peak grad: 4.1 mmHg
Ao pk vel: 1.01 m/s
Area-P 1/2: 3.54 cm2
S' Lateral: 3.2 cm
Weight: 2172.85 oz

## 2023-03-23 MED ORDER — PANCRELIPASE (LIP-PROT-AMYL) 12000-38000 UNITS PO CPEP
24000.0000 [IU] | ORAL_CAPSULE | Freq: Three times a day (TID) | ORAL | Status: DC
  Administered 2023-03-23 – 2023-03-27 (×10): 24000 [IU] via ORAL
  Filled 2023-03-23 (×13): qty 2

## 2023-03-23 MED ORDER — PANTOPRAZOLE SODIUM 40 MG PO TBEC
40.0000 mg | DELAYED_RELEASE_TABLET | Freq: Every day | ORAL | Status: DC
  Administered 2023-03-23 – 2023-03-27 (×5): 40 mg via ORAL
  Filled 2023-03-23 (×5): qty 1

## 2023-03-23 NOTE — Progress Notes (Signed)
Progress Note   Patient: Katrina Rose ZOX:[id number] DOB: [birth year] DOA: 03/21/2023     2 DOS: the patient was seen and examined on 03/23/2023   Brief hospital course: Katrina Rose is a 62 y.o. female with medical history significant for hypertension, early onset dementia, chronic systolic CHF (EF 30 to 35% 07/2021) , LBBB, cardiomyopathy s/p implantable ICD recently doing well from a cardiovascular standpoint when last seen by her cardiologist on 02/26/2023 who presents to the ED with a syncopal episode.  Patient also had a recent diarrhea and tarry stools. Upon arriving the emergency room, hemoglobin was 7.9, received 2 units of PRBC. Patient also seen by GI, placed on PPI, EGD planned.   Principal Problem:   Anemia due to gastrointestinal blood loss, suspect chronic Active Problems:   Symptomatic anemia   Syncope   Hypotension   Acute renal failure superimposed on stage 3a chronic kidney disease (HCC)   Dementia with behavioral disturbance (HCC)   Hypertension   Chronic systolic CHF (congestive heart failure), NYHA class 3 (HCC)   Cardiomyopathy (HCC)   AICD (automatic cardioverter/defibrillator) present   LBBB (left bundle branch block)   History of Clostridioides difficile infection 2017   Metabolic acidosis   Chronic pancreatitis (HCC)   Assessment and Plan:  * Anemia due to gastrointestinal blood loss, suspect chronic Symptomatic anemia/melena Patient received 2 units of PRBC, hemoglobin has improved.  EGD did not show any significant abnormality, patient does not appear to have any acute bleeding.   Syncope and collapse. Hypotension History of primary hypertension Appears secondary to dehydration with recent diarrhea.  Patient received blood transfusion and IV fluids, blood pressure has been stabilized.  Continue hold off blood pressure medicines.   Acute renal failure superimposed on stage 3a chronic kidney disease (HCC) Mild metabolic acidosis. Reviewed  records from outside source, most recent creatinine was 1.2 in April, worsening renal function due to dehydration and blood loss. Renal function had improved, back to baseline.  Discontinue fluids. Continue sodium bicarb orally.  Chronic pancreatitis. History of Clostridioides difficile infection 2017 Recent testing of C. difficile was negative. Patient has required intermittent loose stools, imaging study showed chronic pancreatitis.  Added Creon.   Chronic systolic CHF (congestive heart failure), NYHA class 3 (HCC) Cardiomyopathy s/p AICD Bradycardia of 43  Patient is a placed on gentle rehydration for acute renal failure.  She does not have any volume overload.  Will monitor closely.  Restart Coreg at a reduced dose.   Dementia with behavioral disturbance (HCC) Restarted Seroquel.      Subjective:  Patient is confused, but no complaint.  Physical Exam: Vitals:   03/22/23 2015 03/22/23 2315 03/23/23 0500 03/23/23 0751  BP: 106/66 110/66 112/70 114/72  Pulse: 79 72 66 66  Resp: 18 17 17 16   Temp: 98.5 F (36.9 C) 98.2 F (36.8 C) 98.2 F (36.8 C) 98.1 F (36.7 C)  TempSrc:  Oral Oral Oral  SpO2: 100% 100% 100% 100%  Weight:      Height:       General exam: Appears calm and comfortable  Respiratory system: Clear to auscultation. Respiratory effort normal. Cardiovascular system: S1 & S2 heard, RRR. No JVD, murmurs, rubs, gallops or clicks. No pedal edema. Gastrointestinal system: Abdomen is nondistended, soft and nontender. No organomegaly or masses felt. Normal bowel sounds heard. Central nervous system: Alert and oriented x1. No focal neurological deficits. Extremities: Symmetric 5 x 5 power. Skin: No rashes, lesions or ulcers Psychiatry: Mood & affect  appropriate.    Data Reviewed:  Lab results reviewed.  Family Communication: Daughter and niece updated at bedside.  Disposition: Status is: Inpatient Remains inpatient appropriate because: Unsafe discharge,   Family cannot bring patient home, currently looking for assisted living facility.     Time spent: 35 minutes  Author: Marrion Coy, MD 03/23/2023 12:22 PM  For on call review [email or web address].

## 2023-03-23 NOTE — Progress Notes (Addendum)
Only use Purewick at night to allow patient longer peroids of rest

## 2023-03-23 NOTE — [place] FL2 (Signed)
[place] MEDICAID FL2 LEVEL OF CARE FORM     IDENTIFICATION  Patient Name: Katrina Rose Birthdate: 1961/01/03 Sex: female Admission Date (Current Location): 03/21/2023  [facility] and [place] Number:  Chiropodist and Address:  [facility], [address], [place], [place] [address]      Provider Number: [id number]  Attending Physician Name and Address:  Marrion Coy, MD  Relative Name and Phone Number:       Current Level of Care: Hospital Recommended Level of Care: Skilled Nursing Facility, Memory Care Prior Approval Number:    Date Approved/Denied:   PASRR Number: [id number]  Discharge Plan: SNF (Memory Care)    Current Diagnoses: Patient Active Problem List   Diagnosis Date Noted   Chronic pancreatitis (HCC) 03/23/2023   Metabolic acidosis 03/22/2023   Symptomatic anemia 03/21/2023   Anemia due to gastrointestinal blood loss, suspect chronic 03/21/2023   Dementia with behavioral disturbance (HCC) 03/21/2023   Hypertension 03/21/2023   Acute renal failure superimposed on stage 3a chronic kidney disease (HCC) 03/21/2023   Hypotension 03/21/2023   IDA (iron deficiency anemia) 03/21/2023   AICD (automatic cardioverter/defibrillator) present 03/21/2023   LBBB (left bundle branch block) 03/21/2023   History of Clostridioides difficile infection 2017 03/21/2023   Syncope 03/21/2023   DAT (dementia of Alzheimer type) (HCC) 07/30/2022   C. difficile diarrhea 07/20/2016   Chronic systolic CHF (congestive heart failure), NYHA class 3 (HCC) 09/07/2014   Cardiomyopathy (HCC) 04/06/2014    Orientation RESPIRATION BLADDER Height & Weight     Self  Normal Incontinent Weight: 135 lb 12.9 oz (61.6 kg) Height:  5\' 1"  (154.9 cm)  BEHAVIORAL SYMPTOMS/MOOD NEUROLOGICAL BOWEL NUTRITION STATUS   (None)  (Dementia) Incontinent Diet (Heart healthy)  AMBULATORY STATUS COMMUNICATION OF NEEDS Skin   Supervision Verbally Normal                        Personal Care Assistance Level of Assistance              Functional Limitations Info  Sight, Hearing, Speech Sight Info: Adequate Hearing Info: Adequate Speech Info: Adequate    SPECIAL CARE FACTORS FREQUENCY                       Contractures Contractures Info: Not present    Additional Factors Info  Code Status, Allergies Code Status Info: DNR Allergies Info: Sulfa Antibiotics           Current Medications (03/23/2023):  This is the current hospital active medication list Current Facility-Administered Medications  Medication Dose Route Frequency Provider Last Rate Last Admin   0.9 %  sodium chloride infusion (Manually program via Guardrails IV Fluids)   Intravenous Once Sharyn Creamer, MD       0.9 %  sodium chloride infusion  10 mL/hr Intravenous Once Sharyn Creamer, MD       acetaminophen (TYLENOL) tablet 650 mg  650 mg Oral Q6H PRN Andris Baumann, MD   650 mg at 03/23/23 2952   Or   acetaminophen (TYLENOL) suppository 650 mg  650 mg Rectal Q6H PRN Andris Baumann, MD       atorvastatin (LIPITOR) tablet 20 mg  20 mg Oral Daily Lindajo Royal V, MD   20 mg at 03/23/23 0856   buPROPion (WELLBUTRIN XL) 24 hr tablet 150 mg  150 mg Oral Daily Lindajo Royal V, MD   150 mg at 03/23/23 0900  carvedilol (COREG) tablet 3.125 mg  3.125 mg Oral BID WC Marrion Coy, MD   3.125 mg at 03/23/23 0856   haloperidol lactate (HALDOL) injection 1 mg  1 mg Intravenous Q6H PRN Andris Baumann, MD       lipase/protease/amylase (CREON) capsule 24,000 Units  24,000 Units Oral TID Valentino Hue, MD       melatonin tablet 10 mg  10 mg Oral QHS Lindajo Royal V, MD   10 mg at 03/22/23 2034   ondansetron (ZOFRAN) tablet 4 mg  4 mg Oral Q6H PRN Andris Baumann, MD       Or   ondansetron [facility]) injection 4 mg  4 mg Intravenous Q6H PRN Andris Baumann, MD       pantoprazole (PROTONIX) EC tablet 40 mg  40 mg Oral Daily Marrion Coy, MD   40 mg at 03/23/23 1301   QUEtiapine (SEROQUEL)  tablet 100 mg  100 mg Oral QHS Marrion Coy, MD   100 mg at 03/22/23 2033   rivastigmine (EXELON) capsule 3 mg  3 mg Oral BID Lindajo Royal V, MD   3 mg at 03/23/23 0900   sodium chloride flush (NS) 0.9 % injection 3 mL  3 mL Intravenous Q12H Andris Baumann, MD   3 mL at 03/22/23 2130     Discharge Medications: Please see discharge summary for a list of discharge medications.  Relevant Imaging Results:  Relevant Lab Results:   Additional Information SS#: [id number]  Margarito Liner, LCSW

## 2023-03-23 NOTE — TOC Progression Note (Addendum)
Transition of Care [facility]) - Progression Note    Patient Details  Name: Katrina Rose MRN: [id number] Date of Birth: 1961-05-31  [facility]) CM/SW Contact  Margarito Liner, LCSW Phone Number: 03/23/2023, 10:03 AM  Clinical Narrative:   CSW left voicemail for administrator at [facility] to see if they have any bed availability.  1:52: There are no beds at [facility] and administrator does not anticipate having any open up any time soon. CSW met with patient and sister to discussed. Sister called and spoke to [employer] this morning. CSW left admissions coordinator a voicemail. [facility] does not have any memory care beds. Faxed referral to [facility]  for review. Sister went to view [facility] but she was able to walk in the building, no staff members around until she found them all together after walking around the building. She is concerned this would not be a safe place for patient to go. Discussed placement outside [facility] if needed. Will expand search if unable to find placement here. CSW spoke to DSS placement social worker, Domingo Mend [phone]), this morning. She confirmed Medicaid is pending and they are waiting on placement to approve. Medicaid application was started on 4/10.   2:15 pm: Received call back from [facility]. She does not have any LTC beds right now but will review. Their memory care unit is not in use yet so they do not have a locked unit for her to go to.  Expected Discharge Plan and Services                                               Social Determinants of Health (SDOH) Interventions SDOH Screenings   Food Insecurity: No Food Insecurity (03/22/2023)  Housing: Patient Unable To Answer (03/22/2023)  Transportation Needs: No Transportation Needs (03/22/2023)  Utilities: Not At Risk (03/22/2023)  Tobacco Use: High Risk (03/22/2023)    Readmission Risk Interventions     No data to display

## 2023-03-23 NOTE — Progress Notes (Signed)
Daily Progress Note   Patient Name: Katrina Rose       Date: 03/23/2023 DOB: 11/04/1960  Age: 62 y.o. MRN#: [id number] Attending Physician: Marrion Coy, MD Primary Care Physician: Kandyce Rud, MD Admit Date: 03/21/2023  Reason for Consultation/Follow-up: Establishing goals of care and Ethics  Subjective: Notes and labs reviewed.  In to see patient.  Patient's sister and neighbor are sitting at bedside.  Sister discusses that she is primary H POA and patient's daughter Encarnacion Slates is secondary POA.  He discusses that they are making decisions together.  Sister discusses camera systems and the patient's neighbor going every couple of hours to check on her.  They discussed the patient's agitation and sometimes physical aggression.  They discussed that she paces within the apartment, and states there is no telling how many miles she walks per day.  Sister neighbor advised that she eats everything available to her, and so they have to limit the food available.  They state she uses depends as she is incontinent of bowel and bladder and does not know when she uses the bathroom.  Sister and neighbor advised that they have to help her with all ADLs.  They share that she has to be tucked into bed or she will never bed and go to sleep.  They discussed that they would like placement as she is not safe to go back to her apartment.  She discusses the places that she has toured and the conversations that she currently has in process for placement.  Sister states they have not completed the MOST form document.  She confirms DNR status and that they would not want any heroics.  Discussed her ICD.  She states she and patient's daughter have discussed the ICD in the past, but  will further good discuss potentially  deactivating the ICD portion of her pacemaker defibrillator.  Addendum: Update and recommendations provided to daughter Encarnacion Slates who is second HPOA. She states she and her aunt will talk further.     Length of Stay: 2  Current Medications: Scheduled Meds:   sodium chloride   Intravenous Once   atorvastatin  20 mg Oral Daily   buPROPion  150 mg Oral Daily   carvedilol  3.125 mg Oral BID WC   melatonin  10 mg Oral QHS   pantoprazole (PROTONIX)  IV  40 mg Intravenous Q24H   QUEtiapine  100 mg Oral QHS   rivastigmine  3 mg Oral BID   sodium chloride flush  3 mL Intravenous Q12H    Continuous Infusions:  sodium chloride     lactated ringers 75 mL/hr at 03/22/23 2152    PRN Meds: acetaminophen **OR** acetaminophen, haloperidol lactate, ondansetron **OR** ondansetron (ZOFRAN) IV  Physical Exam Constitutional:      Comments: Eyes closed.  Pulmonary:     Effort: Pulmonary effort is normal.     Comments: Even and unlabored respirations.            Vital Signs: BP 114/72 (BP Location: Left Arm)   Pulse 66   Temp 98.1 F (36.7 C) (Oral)   Resp 16   Ht 5\' 1"  (1.549 m)   Wt 61.6 kg   SpO2 100%   BMI 25.66 kg/m  SpO2: SpO2: 100 % O2 Device: O2 Device: Room Air O2 Flow Rate:    Intake/output summary:  Intake/Output Summary (Last 24 hours) at 03/23/2023 1150 Last data filed at 03/23/2023 1100 Gross per 24 hour  Intake 1288.08 ml  Output 1750 ml  Net -461.92 ml   LBM: Last BM Date : 03/22/23 Baseline Weight: Weight: 61.6 kg Most recent weight: Weight: 61.6 kg     Patient Active Problem List   Diagnosis Date Noted   Metabolic acidosis 03/22/2023   Symptomatic anemia 03/21/2023   Anemia due to gastrointestinal blood loss, suspect chronic 03/21/2023   Dementia with behavioral disturbance (HCC) 03/21/2023   Hypertension 03/21/2023   Acute renal failure superimposed on stage 3a chronic kidney disease (HCC) 03/21/2023   Hypotension 03/21/2023   IDA (iron deficiency  anemia) 03/21/2023   AICD (automatic cardioverter/defibrillator) present 03/21/2023   LBBB (left bundle branch block) 03/21/2023   History of Clostridioides difficile infection 2017 03/21/2023   Syncope 03/21/2023   DAT (dementia of Alzheimer type) (HCC) 07/30/2022   C. difficile diarrhea 07/20/2016   Chronic systolic CHF (congestive heart failure), NYHA class 3 (HCC) 09/07/2014   Cardiomyopathy (HCC) 04/06/2014    Palliative Care Assessment & Plan     Recommendations/Plan: Family waiting for patient placement in a dementia unit.  Code Status:    Code Status Orders  (From admission, onward)           Start     Ordered   03/21/23 2027  Do not attempt resuscitation (DNR)  Continuous       Question Answer Comment  If patient has no pulse and is not breathing Do Not Attempt Resuscitation   If patient has a pulse and/or is breathing: Medical Treatment Goals LIMITED ADDITIONAL INTERVENTIONS: Use medication/IV fluids and cardiac monitoring as indicated; Do not use intubation or mechanical ventilation (DNI), also provide comfort medications.  Transfer to Progressive/Stepdown as indicated, avoid Intensive Care.   Consent: Discussion documented in EHR or advanced directives reviewed      03/21/23 2027           Code Status History     Date Active Date Inactive Code Status Order ID Comments User Context   03/21/2023 1955 03/21/2023 2027 DNR [id number]  Andris Baumann, MD ED   08/01/2021 0856 08/01/2021 1515 Full Code [id number]  Marcina Millard, MD Inpatient   07/20/2016 2011 07/22/2016 1829 Full Code [id number]  Altamese Dilling, MD Inpatient       Prognosis:  Unable to determine    Thank you for allowing the Palliative Medicine Team to assist in  the care of this patient.    Morton Stall, NP  Please contact Palliative Medicine Team phone at [phone] for questions and concerns.

## 2023-03-23 NOTE — Progress Notes (Signed)
I visited Pt and family in my routine rounding the unit. Pt sister was in the room visiting as well.Supported Pt and family in reflective listening as well as offering an intercessory  prayer at the request of the family.   03/23/23 1400  Spiritual Encounters  Type of Visit Initial  Care provided to: Pt and family  Referral source Chaplain assessment  OnCall Visit No  Spiritual Framework  Presenting Themes Goals in life/care;Significant life change;Courage hope and growth  Community/Connection Family  Patient Stress Factors Health changes  Family Stress Factors Major life changes  Interventions  Spiritual Care Interventions Made Reflective listening;Compassionate presence;Established relationship of care and support  Intervention Outcomes  Outcomes Awareness of support;Connection to spiritual care  Spiritual Care Plan  Spiritual Care Issues Still Outstanding No further spiritual care needs at this time (see row info)

## 2023-03-23 NOTE — Progress Notes (Signed)
Patient/Family educated on contact enteric precautions

## 2023-03-24 DIAGNOSIS — N1831 Chronic kidney disease, stage 3a: Secondary | ICD-10-CM | POA: Diagnosis not present

## 2023-03-24 DIAGNOSIS — N171 Acute kidney failure with acute cortical necrosis: Secondary | ICD-10-CM | POA: Diagnosis not present

## 2023-03-24 DIAGNOSIS — Z7189 Other specified counseling: Secondary | ICD-10-CM | POA: Diagnosis not present

## 2023-03-24 DIAGNOSIS — I5022 Chronic systolic (congestive) heart failure: Secondary | ICD-10-CM | POA: Diagnosis not present

## 2023-03-24 DIAGNOSIS — D500 Iron deficiency anemia secondary to blood loss (chronic): Secondary | ICD-10-CM | POA: Diagnosis not present

## 2023-03-24 LAB — GLUCOSE, CAPILLARY: Glucose-Capillary: 91 mg/dL (ref 70–99)

## 2023-03-24 MED ORDER — SODIUM BICARBONATE 650 MG PO TABS
650.0000 mg | ORAL_TABLET | Freq: Two times a day (BID) | ORAL | Status: DC
  Administered 2023-03-24 – 2023-03-27 (×7): 650 mg via ORAL
  Filled 2023-03-24 (×7): qty 1

## 2023-03-24 NOTE — Evaluation (Signed)
Occupational Therapy Evaluation Patient Details Name: Katrina Rose MRN: [id number] DOB: 07/21/1961 Today's Date: 03/24/2023   History of Present Illness Pt is a 62 yo female brought to ED after an episode of feeling very weak, near syncope. S/p 2 transfusions and hydration. PMH of smoking, HTN, CHF, cardiac defibrillator, L ankle surgery, dementia.   Clinical Impression   Patient agreeable to OT evaluation. Pt A&O to self and able to identify familiar people in the room. Per sister, pt lived alone prior to admission with a neighbor checking in throughout the day as able and an aide assisting with bathing on Mondays/Fridays. Pt currently functioning at supervision for STS from recliner and functional mobility at room level without an AD. Pt engaged in LB dressing and sinkside grooming tasks with max multimodal cues required for initiation/sequencing. Pt will benefit from skilled acute OT services to address deficits noted below. OT recommends 24/7 supervision/assistance to maintain safety upon discharge (LTC, memory care, 24/7 caregiver, etc).    Pt completed SLUMS examination this date scoring 3/30 indicating a positive screen for dementia. Of note, it is not within occupational therapy scope of practice to diagnose cognitive impairments, this screen indicates need for further testing. The SLUMS is a 30 point, 11 question screening questionnaire that tests orientation, memory, attention, and executive function. Pt with noted impairments in orientation, short term memory, problem solving, and executive function limiting safety and independence in ADLs/IADLs.  [facility] Mental Status Examination Orientation: 1/3 (oriented to state) Calculations: 0/3 Naming animals: 0/3 Patient named 0 animals (0 points is 0-4 animals; 1 is 5-9 animals; 2 is 10-14 animals; 3 is 15+ animals) Recall: 0/5  Attention: 0/2 Clock drawing: 0/4 Visual Processing: 2/2 Paragraph Memory: 0/8  Total: 3/30;   Given that patient has high school level of education, this score falls in the dementia range.      Recommendations for follow up therapy are one component of a multi-disciplinary discharge planning process, led by the attending physician.  Recommendations may be updated based on patient status, additional functional criteria and insurance authorization.   Assistance Recommended at Discharge Frequent or constant Supervision/Assistance  Patient can return home with the following A little help with walking and/or transfers;Assistance with cooking/housework;Assist for transportation;Help with stairs or ramp for entrance;Direct supervision/assist for medications management;Direct supervision/assist for financial management    Functional Status Assessment  Patient has had a recent decline in their functional status and demonstrates the ability to make significant improvements in function in a reasonable and predictable amount of time.  Equipment Recommendations  None recommended by OT    Recommendations for Other Services       Precautions / Restrictions Restrictions Weight Bearing Restrictions: No      Mobility Bed Mobility               General bed mobility comments: up in chair at start/end of session    Transfers Overall transfer level: Needs assistance Equipment used: None Transfers: Sit to/from Stand Sit to Stand: Supervision                  Balance Overall balance assessment: Needs assistance Sitting-balance support: Feet supported Sitting balance-Leahy Scale: Normal       Standing balance-Leahy Scale: Normal       ADL either performed or assessed with clinical judgement   ADL Overall ADL's : Needs assistance/impaired     Grooming: Set up;Standing;Cueing for sequencing;Supervision/safety Grooming Details (indicate cue type and reason): Pt stood at sink  in order to engage in grooming tasks. Initial VC provided to turn on the correct side of faucet  for warm water. Pt grabbing multiple washcloths from counter. Min VC required to choose one and wet washcloth with warm water. Pt perseverating on drying off sink/counter after washing face.             Lower Body Dressing: Sitting/lateral leans;Sit to/from stand;Cueing for sequencing;Set up;Supervision/safety Lower Body Dressing Details (indicate cue type and reason): Pt able to identify clothing item by name (underwear) & initiated donning over R foot with Min VC, Max multimodal cues required to finish task and don over L foot. Pt then stood from recliner in order to hike underwear over hips in standing. Toilet Transfer: Radiographer, therapeutic Details (indicate cue type and reason): simulated         Functional mobility during ADLs: Supervision/safety (~16XW to bedside sink, no AD, Min VC for safe navigation of environment (pt forgetting how to get back to recliner from sink))       Vision Baseline Vision/History: 1 Wears glasses Patient Visual Report: No change from baseline       Perception     Praxis      Pertinent Vitals/Pain Pain Assessment Pain Assessment: No/denies pain     Hand Dominance Right   Extremity/Trunk Assessment Upper Extremity Assessment Upper Extremity Assessment: Overall WFL for tasks assessed   Lower Extremity Assessment Lower Extremity Assessment: Overall WFL for tasks assessed   Cervical / Trunk Assessment Cervical / Trunk Assessment: Normal   Communication Communication Communication: No difficulties   Cognition Arousal/Alertness: Awake/alert Behavior During Therapy: Flat affect, Restless Overall Cognitive Status: History of cognitive impairments - at baseline           General Comments: Pt oriented to self and family in room. Unable to provide reliable PLOF. Max multimodal cues required for redirection back to task, constantly distracted by items in environment despite therapist's attempts to minimize distractions (tv  turned off, family/friends left room). Frequently talking to stuffed animals in room, asked pt to name animals (said cat was a "puppy" and bunny was a "teddy bear"). Pt benefits from simple one step commands  Unable to follow multi-step commands (I.e. first turn on water, then wet a washcloth).     General Comments       Exercises Other Exercises Other Exercises: OT provided education re: role of OT, OT POC, post acute recs, sitting up for all meals, EOB/OOB mobility with assistance, home/fall safety.     Shoulder Instructions      Home Living Family/patient expects to be discharged to:: Private residence Living Arrangements: Alone Available Help at Discharge: Neighbor;Available PRN/intermittently;Personal care attendant (Pt's neighbors comes every ~2 hours throughout the day, aide comes on M and F to assist with bathing) Type of Home: Apartment Home Access: Stairs to enter Entrance Stairs-Number of Steps: 1   Home Layout: One level     Bathroom Shower/Tub: Chief Strategy Officer: Standard (with toilet riser)     Home Equipment: Toilet riser          Prior Functioning/Environment Prior Level of Function : Needs assist  Cognitive Assist : ADLs (cognitive);Mobility (cognitive) Mobility (Cognitive): Step by step cues ADLs (Cognitive): Step by step cues       Mobility Comments: pt needs mutlimodal cues to attend to task but no physical assistance needed ADLs Comments: Pt has aide that comes on Monday and Friday to assist with bathing. Pt's neighbor assists with  all other ADLs and IADLs, provides supervision as able. Incontinence at baseline (wears depends & unaware when soiled), has labels on items throughout the house (i.e. on drawers in bedroom to identify clothing).        OT Problem List: Decreased cognition;Decreased safety awareness      OT Treatment/Interventions: Self-care/ADL training;Cognitive remediation/compensation;Patient/family  education;Therapeutic activities    OT Goals(Current goals can be found in the care plan section) Acute Rehab OT Goals Patient Stated Goal: family wants placement with increased supervision OT Goal Formulation: With family Time For Goal Achievement: 04/07/23 Potential to Achieve Goals: Fair   OT Frequency: Min 1X/week    Co-evaluation              AM-PAC OT "6 Clicks" Daily Activity     Outcome Measure Help from another person eating meals?: A Little Help from another person taking care of personal grooming?: A Little Help from another person toileting, which includes using toliet, bedpan, or urinal?: A Little Help from another person bathing (including washing, rinsing, drying)?: A Little Help from another person to put on and taking off regular upper body clothing?: A Little Help from another person to put on and taking off regular lower body clothing?: A Little 6 Click Score: 18   End of Session Nurse Communication: Mobility status  Activity Tolerance: Patient tolerated treatment well Patient left: in chair;with call bell/phone within reach;with chair alarm set;with family/visitor present  OT Visit Diagnosis: Other symptoms and signs involving cognitive function;Other abnormalities of gait and mobility (R26.89)                Time: 1426-1500 OT Time Calculation (min): 34 min Charges:  OT General Charges $OT Visit: 1 Visit OT Evaluation $OT Eval Low Complexity: 1 Low  [facility] MS, OTR/L ascom [phone]  03/24/23, 5:22 PM

## 2023-03-24 NOTE — Evaluation (Signed)
Physical Therapy Evaluation Patient Details Name: Katrina Rose MRN: [id number] DOB: 02/21/1961 Today's Date: 03/24/2023  History of Present Illness  Pt is a 62 yo female brought to ED after an episode of feeling very weak, near syncope. S/p 2 transfusions and hydration. PMH of smoking, HTN, CHF, cardiac defibrillator, L ankle surgery, dementia.   Clinical Impression  Pt alert, oriented to self and family in room, disoriented to place, time, situation and unreliable to provide PLOF. Per family in room pt lives alone and has a neighbor that assists (PRN) with brief changes, med management, and checks on her as able.   The patient demonstrated all mobility with supervision indicating her mobility status is at her baseline. BP assessed several times, WFLs (RN notified). Pt need multimodal cueing to attend to task, to continue to follow directions, and for navigation of environment (bathroom tasks, hallway directions, etc). The pt displayed safety awareness deficits as well as poor insight to these deficits. Overall the patient demonstrated a need for 24/7 supervision and assistance to maintain safety at discharge (LTC, memory care, 24/7 caregiver, etc).      Recommendations for follow up therapy are one component of a multi-disciplinary discharge planning process, led by the attending physician.  Recommendations may be updated based on patient status, additional functional criteria and insurance authorization.  Follow Up Recommendations       Assistance Recommended at Discharge Frequent or constant Supervision/Assistance  Patient can return home with the following  Assistance with cooking/housework;Direct supervision/assist for medications management;Assist for transportation;Help with stairs or ramp for entrance;A little help with bathing/dressing/bathroom;Direct supervision/assist for financial management    Equipment Recommendations None recommended by PT  Recommendations for Other  Services       Functional Status Assessment       Precautions / Restrictions Restrictions Weight Bearing Restrictions: No      Mobility  Bed Mobility               General bed mobility comments: up in chair at start/end of session    Transfers Overall transfer level: Needs assistance Equipment used: Rolling walker (2 wheels), None Transfers: Sit to/from Stand Sit to Stand: Supervision                Ambulation/Gait Ambulation/Gait assistance: Supervision Gait Distance (Feet): 500 Feet Assistive device: Rolling walker (2 wheels), None Gait Pattern/deviations: WFL(Within Functional Limits)          Stairs            Wheelchair Mobility    Modified Rankin (Stroke Patients Only)       Balance Overall balance assessment: Needs assistance Sitting-balance support: Feet supported Sitting balance-Leahy Scale: Normal Sitting balance - Comments: able to perform pericare in sitting with supervision     Standing balance-Leahy Scale: Normal                   Standardized Balance Assessment Standardized Balance Assessment : Dynamic Gait Index   Dynamic Gait Index Level Surface: Normal Change in Gait Speed: Normal Gait with Horizontal Head Turns: Normal Gait with Vertical Head Turns: Mild Impairment Gait and Pivot Turn: Normal Step Over Obstacle: Mild Impairment Step Around Obstacles: Normal Steps: Mild Impairment (clinical judgement) Total Score: 21       Pertinent Vitals/Pain Pain Assessment Pain Assessment: No/denies pain    Home Living Family/patient expects to be discharged to:: Private residence Living Arrangements: Alone Available Help at Discharge: Neighbor;Available PRN/intermittently Type of Home: Apartment Home Access: Stairs to  enter   Entrance Stairs-Number of Steps: 1   Home Layout: One level Home Equipment: None      Prior Function Prior Level of Function : Needs assist  Cognitive Assist : ADLs  (cognitive);Mobility (cognitive) Mobility (Cognitive): Step by step cues ADLs (Cognitive): Step by step cues       Mobility Comments: pt needs mutlimodal cues to attend to task but no physical assistance needed ADLs Comments: Pt's neighbor assists with medications, brief changes, supervision as able     Hand Dominance        Extremity/Trunk Assessment   Upper Extremity Assessment Upper Extremity Assessment: Overall WFL for tasks assessed    Lower Extremity Assessment Lower Extremity Assessment: Overall WFL for tasks assessed    Cervical / Trunk Assessment Cervical / Trunk Assessment: Normal  Communication   Communication: No difficulties  Cognition Arousal/Alertness: Awake/alert Behavior During Therapy: Restless Overall Cognitive Status: History of cognitive impairments - at baseline                                 General Comments: pt oriented to self and family in room. not oriented to situation, place, time, unable to provide reliable PLOF        General Comments      Exercises     Assessment/Plan    PT Assessment Patient does not need any further PT services  PT Problem List         PT Treatment Interventions      PT Goals (Current goals can be found in the Care Plan section)       Frequency       Co-evaluation               AM-PAC PT "6 Clicks" Mobility  Outcome Measure Help needed turning from your back to your side while in a flat bed without using bedrails?: None Help needed moving from lying on your back to sitting on the side of a flat bed without using bedrails?: None Help needed moving to and from a bed to a chair (including a wheelchair)?: None Help needed standing up from a chair using your arms (e.g., wheelchair or bedside chair)?: None Help needed to walk in hospital room?: None Help needed climbing 3-5 steps with a railing? : None 6 Click Score: 24    End of Session Equipment Utilized During Treatment: Gait  belt Activity Tolerance: Patient tolerated treatment well Patient left: in chair;with family/visitor present;with nursing/sitter in room Nurse Communication: Mobility status PT Visit Diagnosis: Other abnormalities of gait and mobility (R26.89)    Time: 0981-1914 PT Time Calculation (min) (ACUTE ONLY): 32 min   Charges:     PT Treatments $Therapeutic Activity: 23-37 mins        Olga Coaster PT, DPT 3:34 PM,03/24/23

## 2023-03-24 NOTE — Progress Notes (Signed)
Daily Progress Note   Patient Name: Katrina Rose       Date: 03/24/2023 DOB: 1960-11-30  Age: 62 y.o. MRN#: [id number] Attending Physician: Marrion Coy, MD Primary Care Physician: Kandyce Rud, MD Admit Date: 03/21/2023  Reason for Consultation/Follow-up: Establishing goals of care  Subjective: Notes and labs reviewed. In to see patient; she denies complaint. She is sitting in bedside chair, sister, and others at bedside. She states they are working on placement for her. She discusses her attempts to assist with this. She states she and [place] have discussed deactivating ICD, completing MOST form, and discharging with hospice to follow.    Spoke with daughter Encarnacion Slates who is an ICU nurse for [employer] [employer]. She states she agrees with deactivating ICD, and discharging with hospice to follow.   Length of Stay: 3  Current Medications: Scheduled Meds:   sodium chloride   Intravenous Once   atorvastatin  20 mg Oral Daily   buPROPion  150 mg Oral Daily   lipase/protease/amylase  24,000 Units Oral TID AC   melatonin  10 mg Oral QHS   pantoprazole  40 mg Oral Daily   QUEtiapine  100 mg Oral QHS   rivastigmine  3 mg Oral BID   sodium bicarbonate  650 mg Oral BID   sodium chloride flush  3 mL Intravenous Q12H    Continuous Infusions:  sodium chloride      PRN Meds: acetaminophen **OR** acetaminophen, haloperidol lactate, ondansetron **OR** ondansetron (ZOFRAN) IV  Physical Exam Pulmonary:     Effort: Pulmonary effort is normal.  Neurological:     Mental Status: She is alert.             Vital Signs: BP 104/86 (BP Location: Left Arm)   Pulse 91   Temp 98 F (36.7 C)   Resp 16   Ht 5\' 1"  (1.549 m)   Wt 57.5 kg   SpO2 100%   BMI 23.95 kg/m  SpO2: SpO2: 100 % O2 Device: O2  Device: Room Air O2 Flow Rate:    Intake/output summary:  Intake/Output Summary (Last 24 hours) at 03/24/2023 1546 Last data filed at 03/24/2023 1412 Gross per 24 hour  Intake 360 ml  Output 1451 ml  Net -1091 ml   LBM: Last BM Date : 03/23/23 Baseline Weight: Weight: 61.6 kg Most  recent weight: Weight: 57.5 kg   Patient Active Problem List   Diagnosis Date Noted   Chronic pancreatitis (HCC) 03/23/2023   Metabolic acidosis 03/22/2023   Symptomatic anemia 03/21/2023   Anemia due to gastrointestinal blood loss, suspect chronic 03/21/2023   Dementia with behavioral disturbance (HCC) 03/21/2023   Hypertension 03/21/2023   Acute renal failure superimposed on stage 3a chronic kidney disease (HCC) 03/21/2023   Hypotension 03/21/2023   IDA (iron deficiency anemia) 03/21/2023   AICD (automatic cardioverter/defibrillator) present 03/21/2023   LBBB (left bundle branch block) 03/21/2023   History of Clostridioides difficile infection 2017 03/21/2023   Syncope 03/21/2023   DAT (dementia of Alzheimer type) (HCC) 07/30/2022   C. difficile diarrhea 07/20/2016   Chronic systolic CHF (congestive heart failure), NYHA class 3 (HCC) 09/07/2014   Cardiomyopathy (HCC) 04/06/2014    Palliative Care Assessment & Plan    Recommendations/Plan: Per conversation, family would like ICD deactivated.  They would like hospice to follow at D/C. They are awaiting facility placement.  Code Status:    Code Status Orders  (From admission, onward)           Start     Ordered   03/21/23 2027  Do not attempt resuscitation (DNR)  Continuous       Question Answer Comment  If patient has no pulse and is not breathing Do Not Attempt Resuscitation   If patient has a pulse and/or is breathing: Medical Treatment Goals LIMITED ADDITIONAL INTERVENTIONS: Use medication/IV fluids and cardiac monitoring as indicated; Do not use intubation or mechanical ventilation (DNI), also provide comfort medications.   Transfer to Progressive/Stepdown as indicated, avoid Intensive Care.   Consent: Discussion documented in EHR or advanced directives reviewed      03/21/23 2027           Code Status History     Date Active Date Inactive Code Status Order ID Comments User Context   03/21/2023 1955 03/21/2023 2027 DNR [id number]  Andris Baumann, MD ED   08/01/2021 0856 08/01/2021 1515 Full Code [id number]  Marcina Millard, MD Inpatient   07/20/2016 2011 07/22/2016 1829 Full Code [id number]  Altamese Dilling, MD Inpatient       Prognosis:  Unable to determine   Thank you for allowing the Palliative Medicine Team to assist in the care of this patient.    Morton Stall, NP  Please contact Palliative Medicine Team phone at [phone] for questions and concerns.

## 2023-03-24 NOTE — Care Management Important Message (Signed)
Important Message  Patient Details  Name: Katrina Rose MRN: [id number] Date of Birth: 18-Oct-1961   Medicare Important Message Given:  Yes     Johnell Comings 03/24/2023, 11:15 AM

## 2023-03-24 NOTE — Progress Notes (Signed)
Progress Note   Patient: Katrina Rose:[id number] DOB: 1961/08/26 DOA: 03/21/2023     3 DOS: the patient was seen and examined on 03/24/2023   Brief hospital course: Katrina Rose is a 62 y.o. female with medical history significant for hypertension, early onset dementia, chronic systolic CHF (EF 30 to 35% 07/2021) , LBBB, cardiomyopathy s/p implantable ICD recently doing well from a cardiovascular standpoint when last seen by her cardiologist on 02/26/2023 who presents to the ED with a syncopal episode.  Patient also had a recent diarrhea and tarry stools. Upon arriving the emergency room, hemoglobin was 7.9, received 2 units of PRBC. Patient also seen by GI, placed on PPI, EGD did not see any abnormality.  Hemoglobin has stabilized. TOC is working on long-term care placement.   Principal Problem:   Anemia due to gastrointestinal blood loss, suspect chronic Active Problems:   Symptomatic anemia   Syncope   Hypotension   Acute renal failure superimposed on stage 3a chronic kidney disease (HCC)   Dementia with behavioral disturbance (HCC)   Hypertension   Chronic systolic CHF (congestive heart failure), NYHA class 3 (HCC)   Cardiomyopathy (HCC)   AICD (automatic cardioverter/defibrillator) present   LBBB (left bundle branch block)   History of Clostridioides difficile infection 2017   Metabolic acidosis   Chronic pancreatitis (HCC)   Assessment and Plan:   * Anemia due to gastrointestinal blood loss, suspect chronic Symptomatic anemia/melena Patient received 2 units of PRBC, hemoglobin has improved.  EGD did not show any significant abnormality, patient does not appear to have any acute bleeding. PPIs changed to oral.   Syncope and collapse. Hypotension History of primary hypertension Appears secondary to dehydration with recent diarrhea.  Patient received blood transfusion and IV fluids, blood pressure has been stabilized.  Continue hold off blood pressure medicines.    Acute renal failure superimposed on stage 3a chronic kidney disease (HCC) Mild metabolic acidosis. Reviewed records from outside source, most recent creatinine was 1.2 in April, worsening renal function due to dehydration and blood loss. Renal function had improved, back to baseline.   Continue sodium bicarb orally. Recheck a BMP tomorrow.  Chronic pancreatitis. History of Clostridioides difficile infection 2017 Recent testing of C. difficile was negative. Patient has required intermittent loose stools, imaging study showed chronic pancreatitis.  Added Creon.   Chronic systolic CHF (congestive heart failure), NYHA class 3 (HCC) Cardiomyopathy s/p AICD Bradycardia of 43  Patient is a placed on gentle rehydration for acute renal failure.  She does not have any volume overload.  Will monitor closely.  Restart Coreg at a reduced dose.   Dementia with behavioral disturbance (HCC) Restarted Seroquel, changed to 100 mg nightly. Discontinued Depakote.      Subjective:  Patient is confused, otherwise no complaint.  Physical Exam: Vitals:   03/24/23 0419 03/24/23 0800 03/24/23 1139 03/24/23 1400  BP: 135/79 98/69 (!) 83/59 92/73  Pulse: 77 92 91   Resp: 16 16 14    Temp: 98.4 F (36.9 C) 98.1 F (36.7 C) 98.1 F (36.7 C)   TempSrc: Axillary Oral Oral   SpO2: 96% 98% 99%   Weight: 57.5 kg     Height:       General exam: Appears calm and comfortable  Respiratory system: Clear to auscultation. Respiratory effort normal. Cardiovascular system: S1 & S2 heard, RRR. No JVD, murmurs, rubs, gallops or clicks. No pedal edema. Gastrointestinal system: Abdomen is nondistended, soft and nontender. No organomegaly or masses felt. Normal bowel  sounds heard. Central nervous system: Alert and oriented x1. No focal neurological deficits. Extremities: Symmetric 5 x 5 power. Skin: No rashes, lesions or ulcers Psychiatry:  Mood & affect appropriate.    Data Reviewed:  There are no new  results to review at this time.  Family Communication: None  Disposition: Status is: Inpatient Remains inpatient appropriate because: Unsafe discharge options, pending long-term care placement.     Time spent: 35 minutes  Author: Marrion Coy, MD 03/24/2023 2:38 PM  For on call review [email or web address].

## 2023-03-24 NOTE — Plan of Care (Signed)
  Problem: Cardiac: Goal: Will achieve and/or maintain adequate cardiac output Outcome: Progressing   Problem: Physical Regulation: Goal: Complications related to the disease process, condition or treatment will be avoided or minimized Outcome: Progressing   Problem: Clinical Measurements: Goal: Ability to maintain clinical measurements within normal limits will improve Outcome: Progressing Goal: Will remain free from infection Outcome: Progressing Goal: Diagnostic test results will improve Outcome: Progressing Goal: Respiratory complications will improve Outcome: Progressing   Problem: Activity: Goal: Risk for activity intolerance will decrease Outcome: Progressing   Problem: Nutrition: Goal: Adequate nutrition will be maintained Outcome: Progressing

## 2023-03-24 NOTE — TOC Progression Note (Addendum)
Transition of Care [facility]) - Progression Note    Patient Details  Name: LANEKA DOEPKE MRN: [id number] Date of Birth: Oct 07, 1961  Transition of Care [facility]) CM/SW Contact  Margarito Liner, LCSW Phone Number: 03/24/2023, 10:58 AM  Clinical Narrative:  [facility] does not have a LTC bed at this time and does not have their locked unit in place yet. Sister is aware. She wants to see if we can try to skill her. CSW sent secure chat to MD. CSW told sister that it is very likely insurance will deny but that we will try and she needs to be ready to appeal decision for her. Sister has list of reasons why she feels she needs rehab. [facility] does not have any rehab beds at this time either but will keep them updated. CSW notified sister that [facility] is considering her and has a locked memory care unit.   12:51 pm: [facility] is unable to accept referral.  Expected Discharge Plan and Services                                               Social Determinants of Health (SDOH) Interventions SDOH Screenings   Food Insecurity: No Food Insecurity (03/22/2023)  Housing: Patient Unable To Answer (03/22/2023)  Transportation Needs: No Transportation Needs (03/22/2023)  Utilities: Not At Risk (03/22/2023)  Tobacco Use: High Risk (03/23/2023)    Readmission Risk Interventions     No data to display

## 2023-03-25 DIAGNOSIS — D500 Iron deficiency anemia secondary to blood loss (chronic): Secondary | ICD-10-CM | POA: Diagnosis not present

## 2023-03-25 DIAGNOSIS — R55 Syncope and collapse: Secondary | ICD-10-CM | POA: Diagnosis not present

## 2023-03-25 DIAGNOSIS — E861 Hypovolemia: Secondary | ICD-10-CM | POA: Diagnosis not present

## 2023-03-25 DIAGNOSIS — N171 Acute kidney failure with acute cortical necrosis: Secondary | ICD-10-CM | POA: Diagnosis not present

## 2023-03-25 DIAGNOSIS — I959 Hypotension, unspecified: Secondary | ICD-10-CM

## 2023-03-25 LAB — BASIC METABOLIC PANEL
Anion gap: 7 (ref 5–15)
BUN: 18 mg/dL (ref 8–23)
CO2: 23 mmol/L (ref 22–32)
Calcium: 8.5 mg/dL — ABNORMAL LOW (ref 8.9–10.3)
Chloride: 105 mmol/L (ref 98–111)
Creatinine, Ser: 1.02 mg/dL — ABNORMAL HIGH (ref 0.44–1.00)
GFR, Estimated: >60 mL/min (ref >60)
Glucose, Bld: 116 mg/dL — ABNORMAL HIGH (ref 70–99)
Potassium: 3.8 mmol/L (ref 3.5–5.1)
Sodium: 135 mmol/L (ref 135–145)

## 2023-03-25 LAB — CBC
HCT: 35.1 % — ABNORMAL LOW (ref 36.0–46.0)
Hemoglobin: 11.4 g/dL — ABNORMAL LOW (ref 12.0–15.0)
MCH: 27.4 pg (ref 26.0–34.0)
MCHC: 32.5 g/dL (ref 30.0–36.0)
MCV: 84.4 fL (ref 80.0–100.0)
Platelets: 216 10*3/uL (ref 150–400)
RBC: 4.16 MIL/uL (ref 3.87–5.11)
RDW: 15.8 % — ABNORMAL HIGH (ref 11.5–15.5)
WBC: 5.1 10*3/uL (ref 4.0–10.5)
nRBC: 0 % (ref 0.0–0.2)

## 2023-03-25 LAB — GLUCOSE, CAPILLARY: Glucose-Capillary: 101 mg/dL — ABNORMAL HIGH (ref 70–99)

## 2023-03-25 LAB — MAGNESIUM: Magnesium: 1.6 mg/dL — ABNORMAL LOW (ref 1.7–2.4)

## 2023-03-25 NOTE — TOC Progression Note (Addendum)
Transition of Care [facility]) - Progression Note    Patient Details  Name: Katrina Rose MRN: [id number] Date of Birth: September 30, 1961  Transition of Care [facility]) CM/SW Contact  Margarito Liner, LCSW Phone Number: 03/25/2023, 12:47 PM  Clinical Narrative:   Left voicemail for admissions coordinator at [facility] in [place]. They have a memory care unit.   3:39 pm: CSW spoke to admissions Interior and spatial designer at [facility]. Faxed referral for review.  Expected Discharge Plan and Services                                               Social Determinants of Health (SDOH) Interventions SDOH Screenings   Food Insecurity: No Food Insecurity (03/22/2023)  Housing: Patient Unable To Answer (03/22/2023)  Transportation Needs: No Transportation Needs (03/22/2023)  Utilities: Not At Risk (03/22/2023)  Tobacco Use: High Risk (03/23/2023)    Readmission Risk Interventions     No data to display

## 2023-03-25 NOTE — Plan of Care (Signed)
PMT shadowing.  Patient is waiting for facility placement. Will continue to shadow for decline.

## 2023-03-25 NOTE — [place] FL2 (Signed)
[place] MEDICAID FL2 LEVEL OF CARE FORM     IDENTIFICATION  Patient Name: Katrina Rose Birthdate: Apr 24, 1961 Sex: female Admission Date (Current Location): 03/21/2023  [facility] [place] and [place] Number:  Chiropodist and Address:  [facility], [address], [place], [place] [address]      Provider Number: [id number]  Attending Physician Name and Address:  [name], MD  Relative Name and Phone Number:       Current Level of Care: Hospital Recommended Level of Care: Skilled Nursing Facility, Memory Care (with hospice) Prior Approval Number:    Date Approved/Denied:   PASRR Number: [id number]  Discharge Plan: SNF (Memory Care with hospice)    Current Diagnoses: Patient Active Problem List   Diagnosis Date Noted   Chronic pancreatitis (HCC) 03/23/2023   Metabolic acidosis 03/22/2023   Symptomatic anemia 03/21/2023   Anemia due to gastrointestinal blood loss, suspect chronic 03/21/2023   Dementia with behavioral disturbance (HCC) 03/21/2023   Hypertension 03/21/2023   Acute renal failure superimposed on stage 3a chronic kidney disease (HCC) 03/21/2023   Hypotension 03/21/2023   IDA (iron deficiency anemia) 03/21/2023   AICD (automatic cardioverter/defibrillator) present 03/21/2023   LBBB (left bundle branch block) 03/21/2023   History of Clostridioides difficile infection 2017 03/21/2023   Syncope 03/21/2023   DAT (dementia of Alzheimer type) (HCC) 07/30/2022   C. difficile diarrhea 07/20/2016   Chronic systolic CHF (congestive heart failure), NYHA class 3 (HCC) 09/07/2014   Cardiomyopathy (HCC) 04/06/2014    Orientation RESPIRATION BLADDER Height & Weight     Self  Normal Incontinent Weight: 126 lb 15.8 oz (57.6 kg) Height:  5\' 1"  (154.9 cm)  BEHAVIORAL SYMPTOMS/MOOD NEUROLOGICAL BOWEL NUTRITION STATUS   (None)  (Dementia) Incontinent Diet (Heart healthy)  AMBULATORY STATUS COMMUNICATION OF NEEDS Skin   Supervision  Verbally Normal                       Personal Care Assistance Level of Assistance              Functional Limitations Info  Sight, Hearing, Speech Sight Info: Adequate Hearing Info: Adequate Speech Info: Adequate    SPECIAL CARE FACTORS FREQUENCY                       Contractures Contractures Info: Not present    Additional Factors Info  Code Status, Allergies Code Status Info: DNR Allergies Info: Sulfa Antibiotics           Current Medications (03/25/2023):  This is the current hospital active medication list Current Facility-Administered Medications  Medication Dose Route Frequency Provider Last Rate Last Admin   0.9 %  sodium chloride infusion (Manually program via Guardrails IV Fluids)   Intravenous Once Sharyn Creamer, MD       0.9 %  sodium chloride infusion  10 mL/hr Intravenous Once Sharyn Creamer, MD       acetaminophen (TYLENOL) tablet 650 mg  650 mg Oral Q6H PRN Andris Baumann, MD   650 mg at 03/24/23 1144   Or   acetaminophen (TYLENOL) suppository 650 mg  650 mg Rectal Q6H PRN Andris Baumann, MD       atorvastatin (LIPITOR) tablet 20 mg  20 mg Oral Daily Lindajo Royal V, MD   20 mg at 03/25/23 1028   buPROPion (WELLBUTRIN XL) 24 hr tablet 150 mg  150 mg Oral Daily Andris Baumann, MD   150  mg at 03/25/23 1028   haloperidol lactate (HALDOL) injection 1 mg  1 mg Intravenous Q6H PRN Andris Baumann, MD       lipase/protease/amylase (CREON) capsule 24,000 Units  24,000 Units Oral TID Valentino Hue, MD   24,000 Units at 03/25/23 1409   melatonin tablet 10 mg  10 mg Oral QHS Lindajo Royal V, MD   10 mg at 03/24/23 2102   ondansetron (ZOFRAN) tablet 4 mg  4 mg Oral Q6H PRN Andris Baumann, MD       Or   ondansetron [facility]) injection 4 mg  4 mg Intravenous Q6H PRN Andris Baumann, MD       pantoprazole (PROTONIX) EC tablet 40 mg  40 mg Oral Daily Marrion Coy, MD   40 mg at 03/25/23 1028   QUEtiapine (SEROQUEL) tablet 100 mg  100 mg Oral QHS Marrion Coy, MD   100 mg at 03/24/23 2101   rivastigmine (EXELON) capsule 3 mg  3 mg Oral BID Andris Baumann, MD   3 mg at 03/25/23 1028   sodium bicarbonate tablet 650 mg  650 mg Oral BID Marrion Coy, MD   650 mg at 03/25/23 1028   sodium chloride flush (NS) 0.9 % injection 3 mL  3 mL Intravenous Q12H Andris Baumann, MD   3 mL at 03/24/23 0900     Discharge Medications: Please see discharge summary for a list of discharge medications.  Relevant Imaging Results:  Relevant Lab Results:   Additional Information SS#: [id number]  Margarito Liner, LCSW

## 2023-03-25 NOTE — Progress Notes (Signed)
[facility]  PROGRESS NOTE  Katrina Rose ZOX:[id number] DOB: 02-13-1961 DOA: 03/21/2023 PCP: Kandyce Rud, MD   Brief HPI:   62 y.o. female with medical history significant for hypertension, early onset dementia, chronic systolic CHF (EF 30 to 35% 07/2021) , LBBB, cardiomyopathy s/p implantable ICD recently doing well from a cardiovascular standpoint when last seen by her cardiologist on 02/26/2023 who presents to the ED with a syncopal episode.  Patient also had a recent diarrhea and tarry stools. Upon arriving the emergency room, hemoglobin was 7.9, received 2 units of PRBC. Patient also seen by GI, placed on PPI, EGD did not see any abnormality.  Hemoglobin has stabilized. TOC is working on long-term care placement.       Assessment/Plan:     Anemia due to GI blood loss -Patient received 2 units PRBC; hemoglobin improved -EGD did not show significant abnormality -Continue Protonix 40 mg daily  Syncope and collapse/hypotension -Felt likely from dehydration and recent diarrhea -Resolved after blood transfusion and IV fluids -Blood pressure medications are currently on hold  Acute kidney injury on CKD stage III -Recent creatinine was 1.2 in April -Presented with worsening creatinine due to dehydration and blood loss -Renal function improved back to baseline -Continue sodium bicarb tablets 650 mg p.o. twice daily -Creatinine is down to 1.02  Chronic pancreatitis -History of C. difficile infection in 2017 -Recent C. difficile testing was negative -She has intermittent loose stools -Imaging showed chronic pancreatitis -Started on Creon  Chronic systolic heart failure -Continue Coreg  Dementia with behavior disturbance -Restarted Seroquel 100 mg p.o. nightly -Depakote discontinued    Medications     sodium chloride   Intravenous Once   atorvastatin  20 mg Oral Daily   buPROPion  150 mg Oral Daily   lipase/protease/amylase  24,000 Units Oral TID AC    melatonin  10 mg Oral QHS   pantoprazole  40 mg Oral Daily   QUEtiapine  100 mg Oral QHS   rivastigmine  3 mg Oral BID   sodium bicarbonate  650 mg Oral BID   sodium chloride flush  3 mL Intravenous Q12H     Data Reviewed:   CBG:  Recent Labs  Lab 03/24/23 0457 03/25/23 0500  GLUCAP 91 101*    SpO2: (!) 82 %    Vitals:   03/25/23 0440 03/25/23 0810 03/25/23 1218 03/25/23 1714  BP: 95/61 101/82 96/67   Pulse: 95 79 91 (!) 41  Resp: 16 18 18    Temp: 98 F (36.7 C) (!) 97.5 F (36.4 C) 98.1 F (36.7 C)   TempSrc:  Oral Oral   SpO2: 95% 97% 96% (!) 82%  Weight: 57.6 kg     Height:          Data Reviewed:  Basic Metabolic Panel: Recent Labs  Lab 03/21/23 1442 03/22/23 0523 03/23/23 0456 03/25/23 0416  NA 138 139 139 135  K 4.9 4.5 4.2 3.8  CL 109 112* 107 105  CO2 23 20* 21* 23  GLUCOSE 112* 80 82 116*  BUN 34* 31* 23 18  CREATININE 2.44* 1.95* 1.31* 1.02*  CALCIUM 8.3* 9.0 8.9 8.5*  MG  --   --   --  1.6*    CBC: Recent Labs  Lab 03/21/23 1442 03/21/23 1912 03/21/23 2355 03/22/23 0523 03/22/23 1642 03/23/23 0456 03/25/23 0416  WBC 11.0*  --   --   --   --   --  5.1  HGB 7.9*   < >  10.0* 10.3* 11.5* 11.9* 11.4*  HCT 25.9*  --   --   --   --   --  35.1*  MCV 86.3  --   --   --   --   --  84.4  PLT 262  --   --   --   --   --  216   < > = values in this interval not displayed.    LFT Recent Labs  Lab 03/21/23 1442  AST 14*  ALT 9  ALKPHOS 82  BILITOT 0.5  PROT 6.7  ALBUMIN 3.7     Antibiotics: Anti-infectives (From admission, onward)    None        DVT prophylaxis: SCDs  Code Status: DNR  Family Communication: No family at bedside   CONSULTS    Subjective   Patient seen examined, denies any symptoms today.   Objective    Physical Examination:   General-appears in no acute distress Heart-S1-S2, regular, no murmur auscultated Lungs-clear to auscultation bilaterally, no wheezing or crackles  auscultated Abdomen-soft, nontender, no organomegaly Extremities-no edema in the lower extremities Neuro-alert, oriented x3, no focal deficit noted   Status is: Inpatient:             [name]   [facility] If 7PM-7AM, please contact night-coverage at [email or web address], Office  [phone]   03/25/2023, 5:14 PM  LOS: 4 days

## 2023-03-25 NOTE — Progress Notes (Signed)
Mobility Specialist - Progress Note     03/25/23 1633  Mobility  Activity Ambulated with assistance in hallway  Level of Assistance Contact guard assist, steadying assist  Assistive Device Front wheel walker  Distance Ambulated (ft) 500 ft  Range of Motion/Exercises Active  Activity Response Tolerated well  Mobility Referral Yes  $Mobility charge 1 Mobility   Pt resting in recliner on RA upon entry. Pt STS and ambulates to hallway around NS no AD. Pt had a brief stumble but recovered quickly. Pt given VC and manual redirection. Pt returned to recliner and left with needs in reach.    Johnathan Hausen Mobility Specialist 03/25/23, 4:42 PM

## 2023-03-26 DIAGNOSIS — D500 Iron deficiency anemia secondary to blood loss (chronic): Secondary | ICD-10-CM | POA: Diagnosis not present

## 2023-03-26 DIAGNOSIS — E861 Hypovolemia: Secondary | ICD-10-CM | POA: Diagnosis not present

## 2023-03-26 DIAGNOSIS — N171 Acute kidney failure with acute cortical necrosis: Secondary | ICD-10-CM | POA: Diagnosis not present

## 2023-03-26 DIAGNOSIS — R55 Syncope and collapse: Secondary | ICD-10-CM | POA: Diagnosis not present

## 2023-03-26 LAB — GLUCOSE, CAPILLARY: Glucose-Capillary: 75 mg/dL (ref 70–99)

## 2023-03-26 NOTE — Significant Event (Signed)
Dr. Juliann Pares and I were contacted by Dr. Mauro Kaufmann to turn off this patient's ICD shock therapies per the patient's sister and daughter (shared HPOA) request. Chart reviewed including palliative care notes. Verbal consent obtained from the patient's sister at bedside to turn off ICD shock therapies, order placed in chart. I interrogated the patient's Medtronic ICD at the bedside and turned off all therapies (VT, VF). Confirmation sent to [facility] office.   Rebeca Allegra, PA-C

## 2023-03-26 NOTE — Progress Notes (Signed)
Occupational Therapy Treatment Patient Details Name: Katrina Rose MRN: [id number] DOB: [birth year] Today's Date: 03/26/2023   History of present illness Pt is a 62 yo female brought to ED after an episode of feeling very weak, near syncope. S/p 2 transfusions and hydration. PMH of smoking, HTN, CHF, cardiac defibrillator, L ankle surgery, dementia.   OT comments  [name] was seen for OT follow up regarding safety and functional independence during ADL management. Pt received in recliner with supportive family at bedside. Pt pleasantly confused t/o session and per caregiver at bedside this is her baseline cognition. She declines and functional activity outside of functional mobility this date. Per sister family has been very active in supporting pt with her ADL/leisure activity needs while acutely hospitalized. Pt is eager to "go for a walk" and performs functional mobility in her room and hall with supervision and cueing for safety t/o. Pt continues to benefit from skilled OT services while acutely hospitalized. Will continue to follow POC as written.    Recommendations for follow up therapy are one component of a multi-disciplinary discharge planning process, led by the attending physician.  Recommendations may be updated based on patient status, additional functional criteria and insurance authorization.    Assistance Recommended at Discharge Frequent or constant Supervision/Assistance  Patient can return home with the following  Assistance with cooking/housework;Assist for transportation;Help with stairs or ramp for entrance;Direct supervision/assist for medications management;Direct supervision/assist for financial management;A little help with bathing/dressing/bathroom   Equipment Recommendations  None recommended by OT    Recommendations for Other Services      Precautions / Restrictions Precautions Precautions: Fall Restrictions Weight Bearing Restrictions: No        Mobility Bed Mobility               General bed mobility comments: up in chair at start/end of session    Transfers Overall transfer level: Needs assistance Equipment used: None Transfers: Sit to/from Stand Sit to Stand: Supervision                 Balance Overall balance assessment: Needs assistance Sitting-balance support: Feet supported Sitting balance-Leahy Scale: Normal       Standing balance-Leahy Scale: Normal                             ADL either performed or assessed with clinical judgement   ADL Overall ADL's : Needs assistance/impaired                                       General ADL Comments: Supervision for safety with functional mobility at community distance. Pt is able to perform ambulation in room and hallway with good endurance and min cueing for safety awareness. Per sister at bedside pt is at or near baseline level of functional independence for BADL Management including dressing and grooming this date. She has family regularly visiting to engage pt in leisure activities as well. Pt had just finished up painting with her granddaughter prior to OT arrival.    Extremity/Trunk Assessment              Vision Baseline Vision/History: 1 Wears glasses Patient Visual Report: No change from baseline     Perception     Praxis      Cognition Arousal/Alertness: Awake/alert Behavior During Therapy: WFL for tasks assessed/performed Overall Cognitive Status:  History of cognitive impairments - at baseline                                 General Comments: Pt oriented to self only. Per family in room, she is at or near baseline level of cogntion. She requires cueing for safety t/o session.        Exercises Other Exercises Other Exercises: Pt educated on role of OT in acute setting, importance of functional activity during hospital stay, and falls prevention strategies for home and hospital.     Shoulder Instructions       General Comments      Pertinent Vitals/ Pain       Pain Assessment Pain Assessment: Faces Faces Pain Scale: Hurts a little bit Pain Location: L hip pain Pain Descriptors / Indicators: Sore, Grimacing Pain Intervention(s): Limited activity within patient's tolerance, Monitored during session, Repositioned  Home Living                                          Prior Functioning/Environment              Frequency  Min 1X/week        Progress Toward Goals  OT Goals(current goals can now be found in the care plan section)  Progress towards OT goals: Progressing toward goals  Acute Rehab OT Goals Patient Stated Goal: "To go to my new apartment" OT Goal Formulation: With family Time For Goal Achievement: 04/07/23 Potential to Achieve Goals: Good  Plan Discharge plan remains appropriate;Frequency remains appropriate    Co-evaluation                 AM-PAC OT "6 Clicks" Daily Activity     Outcome Measure   Help from another person eating meals?: A Little Help from another person taking care of personal grooming?: A Little Help from another person toileting, which includes using toliet, bedpan, or urinal?: A Little Help from another person bathing (including washing, rinsing, drying)?: A Little Help from another person to put on and taking off regular upper body clothing?: A Little Help from another person to put on and taking off regular lower body clothing?: A Little 6 Click Score: 18    End of Session Equipment Utilized During Treatment: Gait belt  OT Visit Diagnosis: Other symptoms and signs involving cognitive function;Other abnormalities of gait and mobility (R26.89)   Activity Tolerance Patient tolerated treatment well   Patient Left in chair;with call bell/phone within reach;with chair alarm set;with family/visitor present   Nurse Communication Mobility status        Time: 1431-1450 OT Time  Calculation (min): 19 min  Charges: OT General Charges $OT Visit: 1 Visit OT Treatments $Self Care/Home Management : 8-22 mins  Rockney Ghee, M.S., OTR/L 03/26/23, 3:05 PM

## 2023-03-26 NOTE — Progress Notes (Signed)
Mobility Specialist - Progress Note   03/26/23 0903  Mobility  Activity Ambulated independently in hallway  Level of Assistance Independent  Assistive Device None  Distance Ambulated (ft) 600 ft  Activity Response Tolerated well  Mobility Referral Yes  $Mobility charge 1 Mobility  Mobility Specialist Start Time (ACUTE ONLY) 0847  Mobility Specialist Stop Time (ACUTE ONLY) 0901  Mobility Specialist Time Calculation (min) (ACUTE ONLY) 14 min   Pt OOB on RA upon arrival. Pt ambulates in hallway indep. Pt needs VC for redirection and safety awareness. Pt returns to room with needs in reach and sister in room.   Terrilyn Saver  Mobility Specialist  03/26/23 9:05 AM

## 2023-03-26 NOTE — Plan of Care (Addendum)
PMT shadowing chart.  Staff still working on placement after discharge.  Spoke with daughter Encarnacion Slates who is an ICU nurse here at [facility].  She states she and her aunt have spoken and are in agreement about deactivating the ICD prior to patient discharging.  Epic chat message sent to attending Dr. Sharl Ma.  PMT will continue to shadow for decline

## 2023-03-26 NOTE — TOC Progression Note (Addendum)
Transition of Care [facility]) - Progression Note    Patient Details  Name: Katrina Rose MRN: [id number] Date of Birth: [birth year]  Transition of [facility]) CM/SW Contact  Margarito Liner, LCSW Phone Number: 03/26/2023, 9:01 AM  Clinical Narrative:  Received call from [facility] late yesterday. They are unable to offer a bed due to not being in network with her insurance.   1:47 pm: [facility] will reviewing referral again. Updated patient and sister at bedside.  Expected Discharge Plan and Services                                               Social Determinants of Health (SDOH) Interventions SDOH Screenings   Food Insecurity: No Food Insecurity (03/22/2023)  Housing: Patient Unable To Answer (03/22/2023)  Transportation Needs: No Transportation Needs (03/22/2023)  Utilities: Not At Risk (03/22/2023)  Tobacco Use: High Risk (03/23/2023)    Readmission Risk Interventions     No data to display

## 2023-03-26 NOTE — Progress Notes (Signed)
[facility]  PROGRESS NOTE  Katrina Rose ZOX:[id number] DOB: Nov 10, 1960 DOA: 03/21/2023 PCP: Kandyce Rud, MD   Brief HPI:   62 y.o. female with medical history significant for hypertension, early onset dementia, chronic systolic CHF (EF 30 to 35% 07/2021) , LBBB, cardiomyopathy s/p implantable ICD recently doing well from a cardiovascular standpoint when last seen by her cardiologist on 02/26/2023 who presents to the ED with a syncopal episode.  Patient also had a recent diarrhea and tarry stools. Upon arriving the emergency room, hemoglobin was 7.9, received 2 units of PRBC. Patient also seen by GI, placed on PPI, EGD did not see any abnormality.  Hemoglobin has stabilized. TOC is working on long-term care placement.       Assessment/Plan:     Anemia due to GI blood loss -Patient received 2 units PRBC; hemoglobin improved -EGD did not show significant abnormality -Continue Protonix 40 mg daily  Syncope and collapse/hypotension -Felt likely from dehydration and recent diarrhea -Resolved after blood transfusion and IV fluids -Blood pressure medications are currently on hold  Acute kidney injury on CKD stage III -Recent creatinine was 1.2 in April -Presented with worsening creatinine due to dehydration and blood loss -Renal function improved back to baseline -Continue sodium bicarb tablets 650 mg p.o. twice daily -Creatinine is down to 1.02  Chronic pancreatitis -History of C. difficile infection in 2017 -Recent C. difficile testing was negative -She has intermittent loose stools -Imaging showed chronic pancreatitis -Started on Creon  Chronic systolic heart failure -Continue Coreg  Dementia with behavior disturbance -Restarted Seroquel 100 mg p.o. nightly -Depakote discontinued  Will consult cardiology to turn off ICD.  Medications     sodium chloride   Intravenous Once   atorvastatin  20 mg Oral Daily   buPROPion  150 mg Oral Daily    lipase/protease/amylase  24,000 Units Oral TID AC   melatonin  10 mg Oral QHS   pantoprazole  40 mg Oral Daily   QUEtiapine  100 mg Oral QHS   rivastigmine  3 mg Oral BID   sodium bicarbonate  650 mg Oral BID   sodium chloride flush  3 mL Intravenous Q12H     Data Reviewed:   CBG:  Recent Labs  Lab 03/24/23 0457 03/25/23 0500 03/26/23 0503  GLUCAP 91 101* 75    SpO2: 100 %    Vitals:   03/25/23 1717 03/25/23 1936 03/25/23 2323 03/26/23 0432  BP: 123/73 99/63 101/60 120/78  Pulse: 93 87 84 75  Resp: 18 18 18 18   Temp: 97.7 F (36.5 C) 98.1 F (36.7 C) 98 F (36.7 C) 98.2 F (36.8 C)  TempSrc: Oral Oral  Oral  SpO2: 98% 98% 98% 100%  Weight:    56.4 kg  Height:          Data Reviewed:  Basic Metabolic Panel: Recent Labs  Lab 03/21/23 1442 03/22/23 0523 03/23/23 0456 03/25/23 0416  NA 138 139 139 135  K 4.9 4.5 4.2 3.8  CL 109 112* 107 105  CO2 23 20* 21* 23  GLUCOSE 112* 80 82 116*  BUN 34* 31* 23 18  CREATININE 2.44* 1.95* 1.31* 1.02*  CALCIUM 8.3* 9.0 8.9 8.5*  MG  --   --   --  1.6*    CBC: Recent Labs  Lab 03/21/23 1442 03/21/23 1912 03/21/23 2355 03/22/23 0523 03/22/23 1642 03/23/23 0456 03/25/23 0416  WBC 11.0*  --   --   --   --   --  5.1  HGB 7.9*   < > 10.0* 10.3* 11.5* 11.9* 11.4*  HCT 25.9*  --   --   --   --   --  35.1*  MCV 86.3  --   --   --   --   --  84.4  PLT 262  --   --   --   --   --  216   < > = values in this interval not displayed.    LFT Recent Labs  Lab 03/21/23 1442  AST 14*  ALT 9  ALKPHOS 82  BILITOT 0.5  PROT 6.7  ALBUMIN 3.7     Antibiotics: Anti-infectives (From admission, onward)    None        DVT prophylaxis: SCDs  Code Status: DNR  Family Communication: No family at bedside   CONSULTS    Subjective   Patient seen and examined, no new complaints.  She is now DNR.  Patient and sister wants ICD to be deactivated.   Objective    Physical Examination:   General-appears  in no acute distress Heart-S1-S2, regular, no murmur auscultated Lungs-clear to auscultation bilaterally, no wheezing or crackles auscultated Abdomen-soft, nontender, no organomegaly Extremities-no edema in the lower extremities Neuro-alert, oriented x3, no focal deficit noted   Status is: Inpatient:             [name]   [facility] If 7PM-7AM, please contact night-coverage at [email or web address], Office  [phone]   03/26/2023, 7:55 AM  LOS: 5 days

## 2023-03-26 NOTE — Progress Notes (Signed)
Dr. Sharl Ma gave order to discontinue tele and to transfer patient to med surg level of care.

## 2023-03-27 DIAGNOSIS — I5022 Chronic systolic (congestive) heart failure: Secondary | ICD-10-CM | POA: Diagnosis not present

## 2023-03-27 DIAGNOSIS — I959 Hypotension, unspecified: Secondary | ICD-10-CM | POA: Diagnosis not present

## 2023-03-27 DIAGNOSIS — D500 Iron deficiency anemia secondary to blood loss (chronic): Secondary | ICD-10-CM | POA: Diagnosis not present

## 2023-03-27 DIAGNOSIS — N179 Acute kidney failure, unspecified: Secondary | ICD-10-CM | POA: Diagnosis not present

## 2023-03-27 LAB — GLUCOSE, CAPILLARY: Glucose-Capillary: 88 mg/dL (ref 70–99)

## 2023-03-27 MED ORDER — RIVASTIGMINE TARTRATE 3 MG PO CAPS: 3.0000 mg | ORAL_CAPSULE | Freq: Two times a day (BID) | ORAL | Status: DC

## 2023-03-27 MED ORDER — PANCRELIPASE (LIP-PROT-AMYL) 24000-76000 UNITS PO CPEP: 24000.0000 [IU] | ORAL_CAPSULE | Freq: Three times a day (TID) | ORAL | Status: DC

## 2023-03-27 MED ORDER — QUETIAPINE FUMARATE 100 MG PO TABS: 100.0000 mg | ORAL_TABLET | Freq: Every day | ORAL | Status: DC

## 2023-03-27 NOTE — Progress Notes (Signed)
Received request from Transitions of Care Manager, Charlynn Court, LCSW, for hospice services at LTC after discharge. Chart and patient information under review by [facility] physician.   Spoke with Selena Crace to initiate education related to hospice philosophy, services, and team approach to care. She verbalized understanding of information given. Per discussion, the plan is for patient to discharge To [facility] today.     Facility to provide needed DME.    Please send signed and completed DNR LTC with patient/family. Please provide prescriptions at discharge as needed to ensure ongoing symptom management.    [facility] information and contact numbers given to family & above information shared with TOC.   Please call with any questions/concerns.    Thank you for the opportunity to participate in this patient's care.  [facility] Liaison [phone]

## 2023-03-27 NOTE — Discharge Summary (Addendum)
Physician Discharge Summary   Patient: [name] MRN: [id number] DOB: 1961/03/20  Admit date:     03/21/2023  Discharge date: 03/27/23  Discharge Physician: [name]   PCP: Kandyce Rud, MD   Recommendations at discharge:   CHF medications on hold If blood pressure improved, patient can be restarted on Coreg 3.125 mg p.o. twice daily and Lasix 20 mg daily  Discharge Diagnoses: Principal Problem:   Anemia due to gastrointestinal blood loss, suspect chronic Active Problems:   Symptomatic anemia   Syncope   Hypotension   Acute renal failure superimposed on stage 3a chronic kidney disease (HCC)   Dementia with behavioral disturbance (HCC)   Hypertension   Chronic systolic CHF (congestive heart failure), NYHA class 3 (HCC)   Cardiomyopathy (HCC)   AICD (automatic cardioverter/defibrillator) present   LBBB (left bundle branch block)   History of Clostridioides difficile infection 2017   Metabolic acidosis   Chronic pancreatitis (HCC)  Resolved Problems:   * No resolved hospital problems. [facility] Course:  Katrina Rose is a 62 y.o. female with medical history significant for hypertension, early onset dementia, chronic systolic CHF (EF 30 to 35% 07/2021) , LBBB, cardiomyopathy s/p implantable ICD recently doing well from a cardiovascular standpoint when last seen by her cardiologist on 02/26/2023 who presents to the ED with a syncopal episode.  Patient also had a recent diarrhea and tarry stools. Upon arriving the emergency room, hemoglobin was 7.9, received 2 units of PRBC. Patient also seen by GI, placed on PPI, EGD did not see any abnormality.  Hemoglobin has stabilized.   Assessment and Plan:   Anemia due to GI blood loss -Patient received 2 units PRBC; hemoglobin improved -EGD did not show significant abnormality -Continue Protonix 40 mg daily   Syncope and collapse/hypotension -Felt likely from dehydration and recent diarrhea -Resolved after blood  transfusion and IV fluids -Blood pressure medications are currently on hold   Acute kidney injury on CKD stage III -Recent creatinine was 1.2 in April -Presented with worsening creatinine due to dehydration and blood loss -Renal function improved back to baseline -Continue sodium bicarb tablets 650 mg p.o. twice daily -Creatinine is down to 1.02   Chronic pancreatitis -History of C. difficile infection in 2017 -Recent C. difficile testing was negative -She has intermittent loose stools -Imaging showed chronic pancreatitis -Started on Creon   Chronic systolic heart failure -Coreg, Aldactone, Lasix on hold due to low blood pressure -If blood pressure improves, she can be started on low-dose of Coreg 3.125 mg p.o. twice daily along with Lasix 20 mg daily -Currently euvolemic -ICD was turned off as per patient request by cardiology   Dementia with behavior disturbance -Restarted Seroquel 100 mg p.o. nightly -Depakote has been discontinued         Consultants: Gastroenterology Procedures performed: EGD Disposition: Skilled nursing facility Diet recommendation:  Discharge Diet Orders (From admission, onward)     Start     Ordered   03/27/23 0000  Diet - low sodium heart healthy        03/27/23 1125           Regular diet DISCHARGE MEDICATION: Allergies as of 03/27/2023       Reactions   Sulfa Antibiotics Hives        Medication List     STOP taking these medications    aspirin EC 81 MG tablet   carvedilol 12.5 MG tablet Commonly known as: COREG   cephALEXin 250 MG  capsule Commonly known as: Keflex   divalproex 250 MG DR tablet Commonly known as: DEPAKOTE   famotidine 40 MG tablet Commonly known as: PEPCID   furosemide 20 MG tablet Commonly known as: LASIX   lisinopril 5 MG tablet Commonly known as: ZESTRIL   magnesium oxide 400 (240 Mg) MG tablet Commonly known as: MAG-OX   spironolactone 25 MG tablet Commonly known as: ALDACTONE        TAKE these medications    atorvastatin 20 MG tablet Commonly known as: LIPITOR Take 20 mg by mouth daily.   benzonatate 200 MG capsule Commonly known as: TESSALON Take 200 mg by mouth 3 (three) times daily as needed.   buPROPion 150 MG 24 hr tablet Commonly known as: WELLBUTRIN XL Take 150 mg by mouth daily.   ferrous sulfate 325 (65 FE) MG tablet Take 325 mg by mouth daily.   Pancrelipase (Lip-Prot-Amyl) 24000-76000 units Cpep Take 1 capsule (24,000 Units total) by mouth 3 (three) times daily before meals.   pantoprazole 40 MG tablet Commonly known as: PROTONIX Take 40 mg by mouth daily.   PROBIOTIC DAILY PO Take 1 capsule by mouth daily.   QUEtiapine 100 MG tablet Commonly known as: SEROQUEL Take 1 tablet (100 mg total) by mouth at bedtime. What changed:  medication strength how much to take when to take this   rivastigmine 3 MG capsule Commonly known as: EXELON Take 1 capsule (3 mg total) by mouth 2 (two) times daily. What changed:  how much to take when to take this        Discharge Exam: Filed Weights   03/26/23 0432 03/27/23 0500 03/27/23 0515  Weight: 56.4 kg 57.5 kg 56.6 kg   General-appears in no acute distress Heart-S1-S2, regular, no murmur auscultated Lungs-clear to auscultation bilaterally, no wheezing or crackles auscultated Abdomen-soft, nontender, no organomegaly Extremities-no edema in the lower extremities Neuro-alert, oriented x3, no focal deficit noted this  Condition at discharge: good  The results of significant diagnostics from this hospitalization (including imaging, microbiology, ancillary and laboratory) are listed below for reference.   Imaging Studies: ECHOCARDIOGRAM COMPLETE  Result Date: 03/23/2023    ECHOCARDIOGRAM REPORT   Patient Name:   Katrina Rose Date of Exam: 03/22/2023 Medical Rec #:  [id number]        Height:       61.0 in Accession #:    [id number]       Weight:       135.8 lb Date of Birth:  27-Jan-1961         BSA:          1.602 m Patient Age:    62 years         BP:           122/59 mmHg Patient Gender: F                HR:           64 bpm. Exam Location:  [facility] Procedure: 2D Echo Indications:    syncope R55  History:        Patient has no prior history of Echocardiogram examinations.  Sonographer:    Elwin Sleight RDCS Referring Phys: [id number] Andris Baumann  Sonographer Comments: Suboptimal parasternal window. Excessive movement and Image acquisition challenging due to respiratory motion. IMPRESSIONS  1. Left ventricular ejection fraction, by estimation, is 40 to 45%. The left ventricle has mildly decreased function. The left ventricle demonstrates global hypokinesis. Left ventricular diastolic parameters  are consistent with Grade I diastolic dysfunction (impaired relaxation).  2. Right ventricular systolic function is normal. The right ventricular size is normal. There is normal pulmonary artery systolic pressure.  3. The mitral valve is normal in structure. No evidence of mitral valve regurgitation. No evidence of mitral stenosis.  4. The aortic valve is tricuspid. Aortic valve regurgitation is trivial. No aortic stenosis is present.  5. The inferior vena cava is normal in size with <50% respiratory variability, suggesting right atrial pressure of 8 mmHg. FINDINGS  Left Ventricle: Left ventricular ejection fraction, by estimation, is 40 to 45%. The left ventricle has mildly decreased function. The left ventricle demonstrates global hypokinesis. The left ventricular internal cavity size was normal in size. There is  no left ventricular hypertrophy. Left ventricular diastolic parameters are consistent with Grade I diastolic dysfunction (impaired relaxation). Right Ventricle: The right ventricular size is normal. No increase in right ventricular wall thickness. Right ventricular systolic function is normal. There is normal pulmonary artery systolic pressure. The tricuspid regurgitant velocity is 2.21 m/s, and  with an  assumed right atrial pressure of 8 mmHg, the estimated right ventricular systolic pressure is 27.5 mmHg. Left Atrium: Left atrial size was normal in size. Right Atrium: Right atrial size was normal in size. Pericardium: There is no evidence of pericardial effusion. Mitral Valve: The mitral valve is normal in structure. No evidence of mitral valve regurgitation. No evidence of mitral valve stenosis. Tricuspid Valve: The tricuspid valve is normal in structure. Tricuspid valve regurgitation is trivial. No evidence of tricuspid stenosis. Aortic Valve: The aortic valve is tricuspid. Aortic valve regurgitation is trivial. No aortic stenosis is present. Aortic valve peak gradient measures 4.1 mmHg. Pulmonic Valve: The pulmonic valve was normal in structure. Pulmonic valve regurgitation is not visualized. No evidence of pulmonic stenosis. Aorta: The aortic root is normal in size and structure. Venous: The inferior vena cava is normal in size with less than 50% respiratory variability, suggesting right atrial pressure of 8 mmHg. IAS/Shunts: No atrial level shunt detected by color flow Doppler. Additional Comments: A device lead is visualized.  LEFT VENTRICLE PLAX 2D LVIDd:         4.30 cm   Diastology LVIDs:         3.20 cm   LV e' medial:    8.70 cm/s LV PW:         0.90 cm   LV E/e' medial:  8.7 LV IVS:        1.00 cm   LV e' lateral:   10.70 cm/s LVOT diam:     1.80 cm   LV E/e' lateral: 7.1 LV SV:         34 LV SV Index:   21 LVOT Area:     2.54 cm  RIGHT VENTRICLE RV Basal diam:  2.90 cm RV S prime:     15.40 cm/s TAPSE (M-mode): 2.8 cm LEFT ATRIUM             Index        RIGHT ATRIUM           Index LA diam:        3.80 cm 2.37 cm/m   RA Area:     14.20 cm LA Vol (A2C):   29.9 ml 18.66 ml/m  RA Volume:   33.20 ml  20.72 ml/m LA Vol (A4C):   30.3 ml 18.91 ml/m LA Biplane Vol: 31.9 ml 19.91 ml/m  AORTIC VALVE  PULMONIC VALVE AV Area (Vmax): 1.78 cm     PV Vmax:        0.87 m/s AV Vmax:         101.00 cm/s  PV Peak grad:   3.0 mmHg AV Peak Grad:   4.1 mmHg     RVOT Peak grad: 1 mmHg LVOT Vmax:      70.70 cm/s LVOT Vmean:     40.900 cm/s LVOT VTI:       0.134 m  AORTA Ao Root diam: 3.10 cm MITRAL VALVE               TRICUSPID VALVE MV Area (PHT): 3.54 cm    TR Peak grad:   19.5 mmHg MV Decel Time: 214 msec    TR Vmax:        221.00 cm/s MV E velocity: 75.50 cm/s MV A velocity: 93.30 cm/s  SHUNTS MV E/A ratio:  0.81        Systemic VTI:  0.13 m                            Systemic Diam: 1.80 cm Chilton Si MD Electronically signed by Chilton Si MD Signature Date/Time: 03/23/2023/8:05:43 AM    Final    CT ABDOMEN PELVIS WO CONTRAST  Result Date: 03/21/2023 CLINICAL DATA:  Lower GI bleed. EXAM: CT ABDOMEN AND PELVIS WITHOUT CONTRAST TECHNIQUE: Multidetector CT imaging of the abdomen and pelvis was performed following the standard protocol without IV contrast. RADIATION DOSE REDUCTION: This exam was performed according to the departmental dose-optimization program which includes automated exposure control, adjustment of the mA and/or kV according to patient size and/or use of iterative reconstruction technique. COMPARISON:  Two-view chest x-ray 02/20/2017. CT of the chest without contrast 06/04/2012. FINDINGS: Lower chest: An ill-defined nodular density in the medial right lower lobe may be partial volume of the hila. Mild dependent atelectasis is present. The lungs are otherwise clear Hepatobiliary: No focal liver abnormality is seen. No gallstones, gallbladder wall thickening, or biliary dilatation. Pancreas: Scattered calcifications are present throughout the pancreas. No acute inflammatory changes present. Spleen: Normal in size without focal abnormality. Adrenals/Urinary Tract: Adrenal glands are normal bilaterally. Kidneys are unremarkable. No stone or obstruction is present. The ureters are normal. The urinary bladder is unremarkable. Stomach/Bowel: Stomach and duodenum are within normal  limits. The small bowel is unremarkable. The terminal vision scratched at the terminal ileum is visualized and normal. The appendix is normal. The ascending and transverse colon are within normal limits. Descending and sigmoid colon are normal. Vascular/Lymphatic: Atherosclerotic calcifications are present within the cavernous internal carotid arteries. No aneurysm is present. No significant adenopathy is present. Reproductive: Uterus and bilateral adnexa are unremarkable. Other: No abdominal wall hernia or abnormality. No abdominopelvic ascites. Musculoskeletal: The vertebral body heights and alignment normal. Bony pelvis is within limits. No acute or focal osseous abnormalities are present. The hips are visualized normal bilaterally. IMPRESSION: 1. No acute or focal lesion to explain the patient's lower GI bleed. 2. Scattered calcifications throughout the pancreas compatible with chronic pancreatitis. No acute inflammatory changes present. 3. Ill-defined nodular density in the medial right lower lobe may be partial volume of the hila. Electronically Signed   By: Marin Roberts M.D.   On: 03/21/2023 16:18    Microbiology: Results for orders placed or performed during the hospital encounter of 07/20/16  C difficile quick scan w PCR reflex     Status: Abnormal  Collection Time: 07/20/16  3:25 PM   Specimen: STOOL  Result Value Ref Range Status   C Diff antigen POSITIVE (A) NEGATIVE Final   C Diff toxin NEGATIVE NEGATIVE Final   C Diff interpretation Results are indeterminate. See PCR results.  Final  Gastrointestinal Panel by PCR , Stool     Status: None   Collection Time: 07/20/16  3:25 PM   Specimen: STOOL  Result Value Ref Range Status   Campylobacter species NOT DETECTED NOT DETECTED Final   Plesimonas shigelloides NOT DETECTED NOT DETECTED Final   Salmonella species NOT DETECTED NOT DETECTED Final   Yersinia enterocolitica NOT DETECTED NOT DETECTED Final   Vibrio species NOT DETECTED  NOT DETECTED Final   Vibrio cholerae NOT DETECTED NOT DETECTED Final   Enteroaggregative E coli (EAEC) NOT DETECTED NOT DETECTED Final   Enteropathogenic E coli (EPEC) NOT DETECTED NOT DETECTED Final   Enterotoxigenic E coli (ETEC) NOT DETECTED NOT DETECTED Final   Shiga like toxin producing E coli (STEC) NOT DETECTED NOT DETECTED Final   E. coli O157 NOT DETECTED NOT DETECTED Final   Shigella/Enteroinvasive E coli (EIEC) NOT DETECTED NOT DETECTED Final   Cryptosporidium NOT DETECTED NOT DETECTED Final   Cyclospora cayetanensis NOT DETECTED NOT DETECTED Final   Entamoeba histolytica NOT DETECTED NOT DETECTED Final   Giardia lamblia NOT DETECTED NOT DETECTED Final   Adenovirus F40/41 NOT DETECTED NOT DETECTED Final   Astrovirus NOT DETECTED NOT DETECTED Final   Norovirus GI/GII NOT DETECTED NOT DETECTED Final   Rotavirus A NOT DETECTED NOT DETECTED Final   Sapovirus (I, II, IV, and V) NOT DETECTED NOT DETECTED Final  Clostridium Difficile by PCR     Status: Abnormal   Collection Time: 07/20/16  3:25 PM  Result Value Ref Range Status   Toxigenic C. Difficile by PCR POSITIVE (A) NEGATIVE Final    Comment: Positive for toxigenic C. difficile with little to no toxin production. Only treat if clinical presentation suggests symptomatic illness.    Labs: CBC: Recent Labs  Lab 03/21/23 1442 03/21/23 1912 03/21/23 2355 03/22/23 0523 03/22/23 1642 03/23/23 0456 03/25/23 0416  WBC 11.0*  --   --   --   --   --  5.1  HGB 7.9*   < > 10.0* 10.3* 11.5* 11.9* 11.4*  HCT 25.9*  --   --   --   --   --  35.1*  MCV 86.3  --   --   --   --   --  84.4  PLT 262  --   --   --   --   --  216   < > = values in this interval not displayed.   Basic Metabolic Panel: Recent Labs  Lab 03/21/23 1442 03/22/23 0523 03/23/23 0456 03/25/23 0416  NA 138 139 139 135  K 4.9 4.5 4.2 3.8  CL 109 112* 107 105  CO2 23 20* 21* 23  GLUCOSE 112* 80 82 116*  BUN 34* 31* 23 18  CREATININE 2.44* 1.95* 1.31*  1.02*  CALCIUM 8.3* 9.0 8.9 8.5*  MG  --   --   --  1.6*   Liver Function Tests: Recent Labs  Lab 03/21/23 1442  AST 14*  ALT 9  ALKPHOS 82  BILITOT 0.5  PROT 6.7  ALBUMIN 3.7   CBG: Recent Labs  Lab 03/24/23 0457 03/25/23 0500 03/26/23 0503 03/27/23 0543  GLUCAP 91 101* 75 88    Discharge time spent: greater than  30 minutes.  Signed: [name], MD [facility] 03/27/2023

## 2023-03-27 NOTE — Care Management Important Message (Signed)
Important Message  Patient Details  Name: Katrina Rose MRN: [id number] Date of Birth: 1961/01/27   Medicare Important Message Given:  Yes     Johnell Comings 03/27/2023, 10:19 AM

## 2023-03-27 NOTE — TOC Progression Note (Signed)
Transition of Care [facility]) - Progression Note    Patient Details  Name: Katrina Rose MRN: [id number] Date of Birth: 1960/12/30  [facility] CM/SW Contact  Margarito Liner, LCSW Phone Number: 03/27/2023, 10:34 AM  Clinical Narrative:  [facility] has assessed patient in-person and can offer her a bed. Patient and sister are aware and have accepted offer. Left voicemail for Domingo Mend at DSS to notify. Made hospice referral to [facility] with [facility].   Expected Discharge Plan and Services                                               Social Determinants of Health (SDOH) Interventions SDOH Screenings   Food Insecurity: No Food Insecurity (03/22/2023)  Housing: Patient Unable To Answer (03/22/2023)  Transportation Needs: No Transportation Needs (03/22/2023)  Utilities: Not At Risk (03/22/2023)  Tobacco Use: High Risk (03/23/2023)    Readmission Risk Interventions     No data to display

## 2023-03-27 NOTE — TOC Transition Note (Signed)
Transition of Care [facility]) - CM/SW Discharge Note   Patient Details  Name: Katrina Rose MRN: [id number] Date of Birth: July 31, 1961  Transition of Care [facility]) CM/SW Contact:  Margarito Liner, LCSW Phone Number: 03/27/2023, 12:03 PM   Clinical Narrative:  Patient has orders to discharge to [facility] SNF today. RN will call report to [phone] (Room 71B). Sister will transport. Facility is ready for her whenever she arrives. They said we do not need to wait on DSS to call CSW back. Their business office will follow up with them. Gustavo Lah Poteat's phone number. No further concerns. CSW signing off.   Final next level of care: Skilled Nursing Facility Barriers to Discharge: Barriers Resolved   Patient Goals and CMS Choice   Choice offered to / list presented to : Patient, Sibling, [facility] POA / Guardian  Discharge Placement PASRR number recieved: 03/23/23 PASRR number recieved: 03/23/23            Patient chooses bed at: [facility] Patient to be transferred to facility by: Sister Name of family member notified: Selena Crace Patient and family notified of of transfer: 03/27/23  Discharge Plan and Services Additional resources added to the After Visit Summary for                                       Social Determinants of Health (SDOH) Interventions SDOH Screenings   Food Insecurity: No Food Insecurity (03/22/2023)  Housing: Patient Unable To Answer (03/22/2023)  Transportation Needs: No Transportation Needs (03/22/2023)  Utilities: Not At Risk (03/22/2023)  Tobacco Use: High Risk (03/23/2023)     Readmission Risk Interventions     No data to display

## 2023-03-27 NOTE — NC FL2 (Signed)
[place] MEDICAID FL2 LEVEL OF CARE FORM     IDENTIFICATION  Patient Name: Katrina Rose Birthdate: 09-12-1961 Sex: female Admission Date (Current Location): 03/21/2023  [place] and [place] Number:  Chiropodist and Address:  [facility], [address] [place], [place] [address]      Provider Number: [id number]  Attending Physician Name and Address:  [name], MD  Relative Name and Phone Number:       Current Level of Care: Hospital Recommended Level of Care: Skilled Nursing facility (with hospice. Needs wanderguard). Prior Approval Number:    Date Approved/Denied:   PASRR Number: [id number]  Discharge Plan: SNF (with hospice. Needs wanderguard).     Current Diagnoses: Patient Active Problem List   Diagnosis Date Noted   Chronic pancreatitis (HCC) 03/23/2023   Metabolic acidosis 03/22/2023   Symptomatic anemia 03/21/2023   Anemia due to gastrointestinal blood loss, suspect chronic 03/21/2023   Dementia with behavioral disturbance (HCC) 03/21/2023   Hypertension 03/21/2023   Acute renal failure superimposed on stage 3a chronic kidney disease (HCC) 03/21/2023   Hypotension 03/21/2023   IDA (iron deficiency anemia) 03/21/2023   AICD (automatic cardioverter/defibrillator) present 03/21/2023   LBBB (left bundle branch block) 03/21/2023   History of Clostridioides difficile infection 2017 03/21/2023   Syncope 03/21/2023   DAT (dementia of Alzheimer type) (HCC) 07/30/2022   C. difficile diarrhea 07/20/2016   Chronic systolic CHF (congestive heart failure), NYHA class 3 (HCC) 09/07/2014   Cardiomyopathy (HCC) 04/06/2014    Orientation RESPIRATION BLADDER Height & Weight     Self, Place  Normal Incontinent Weight: 124 lb 12.5 oz (56.6 kg) Height:  5\' 1"  (154.9 cm)  BEHAVIORAL SYMPTOMS/MOOD NEUROLOGICAL BOWEL NUTRITION STATUS   (None)  (Dementia) Incontinent Diet (Heart healthy)  AMBULATORY STATUS COMMUNICATION OF NEEDS  Skin   Supervision Verbally Normal                       Personal Care Assistance Level of Assistance              Functional Limitations Info  Sight, Hearing, Speech Sight Info: Adequate Hearing Info: Adequate Speech Info: Adequate    SPECIAL CARE FACTORS FREQUENCY                       Contractures Contractures Info: Not present    Additional Factors Info  Code Status, Allergies Code Status Info: DNR Allergies Info: Sulfa Antibiotics           Current Medications (03/27/2023):  This is the current hospital active medication list Current Facility-Administered Medications  Medication Dose Route Frequency Provider Last Rate Last Admin   0.9 %  sodium chloride infusion (Manually program via Guardrails IV Fluids)   Intravenous Once Sharyn Creamer, MD       0.9 %  sodium chloride infusion  10 mL/hr Intravenous Once Sharyn Creamer, MD       acetaminophen (TYLENOL) tablet 650 mg  650 mg Oral Q6H PRN Andris Baumann, MD   650 mg at 03/25/23 2042   Or   acetaminophen (TYLENOL) suppository 650 mg  650 mg Rectal Q6H PRN Andris Baumann, MD       atorvastatin (LIPITOR) tablet 20 mg  20 mg Oral Daily Lindajo Royal V, MD   20 mg at 03/27/23 0857   buPROPion (WELLBUTRIN XL) 24 hr tablet 150 mg  150 mg Oral Daily Andris Baumann, MD  150 mg at 03/27/23 0857   haloperidol lactate (HALDOL) injection 1 mg  1 mg Intravenous Q6H PRN Andris Baumann, MD       lipase/protease/amylase (CREON) capsule 24,000 Units  24,000 Units Oral TID Valentino Hue, MD   24,000 Units at 03/27/23 0857   melatonin tablet 10 mg  10 mg Oral QHS Lindajo Royal V, MD   10 mg at 03/26/23 2029   ondansetron (ZOFRAN) tablet 4 mg  4 mg Oral Q6H PRN Andris Baumann, MD       Or   ondansetron [facility]) injection 4 mg  4 mg Intravenous Q6H PRN Andris Baumann, MD       pantoprazole (PROTONIX) EC tablet 40 mg  40 mg Oral Daily Marrion Coy, MD   40 mg at 03/27/23 0857   QUEtiapine (SEROQUEL) tablet 100 mg  100 mg  Oral QHS Marrion Coy, MD   100 mg at 03/26/23 2029   rivastigmine (EXELON) capsule 3 mg  3 mg Oral BID Andris Baumann, MD   3 mg at 03/27/23 0857   sodium bicarbonate tablet 650 mg  650 mg Oral BID Marrion Coy, MD   650 mg at 03/27/23 0857   sodium chloride flush (NS) 0.9 % injection 3 mL  3 mL Intravenous Q12H Andris Baumann, MD   3 mL at 03/24/23 0900     Discharge Medications: Please see discharge summary for a list of discharge medications.  Relevant Imaging Results:  Relevant Lab Results:   Additional Information SS#: [id number]  Margarito Liner, LCSW

## 2023-03-30 DIAGNOSIS — Z20828 Contact with and (suspected) exposure to other viral communicable diseases: Secondary | ICD-10-CM | POA: Diagnosis not present

## 2023-03-30 DIAGNOSIS — R5381 Other malaise: Secondary | ICD-10-CM | POA: Diagnosis not present

## 2023-03-31 DIAGNOSIS — F22 Delusional disorders: Secondary | ICD-10-CM | POA: Diagnosis not present

## 2023-03-31 DIAGNOSIS — I429 Cardiomyopathy, unspecified: Secondary | ICD-10-CM | POA: Diagnosis not present

## 2023-03-31 DIAGNOSIS — F03918 Unspecified dementia, unspecified severity, with other behavioral disturbance: Secondary | ICD-10-CM | POA: Diagnosis not present

## 2023-03-31 DIAGNOSIS — I5022 Chronic systolic (congestive) heart failure: Secondary | ICD-10-CM | POA: Diagnosis not present

## 2023-03-31 DIAGNOSIS — Z95810 Presence of automatic (implantable) cardiac defibrillator: Secondary | ICD-10-CM | POA: Diagnosis not present

## 2023-03-31 DIAGNOSIS — K861 Other chronic pancreatitis: Secondary | ICD-10-CM | POA: Diagnosis not present

## 2023-03-31 DIAGNOSIS — K219 Gastro-esophageal reflux disease without esophagitis: Secondary | ICD-10-CM | POA: Diagnosis not present

## 2023-03-31 DIAGNOSIS — F339 Major depressive disorder, recurrent, unspecified: Secondary | ICD-10-CM | POA: Diagnosis not present

## 2023-03-31 DIAGNOSIS — I959 Hypotension, unspecified: Secondary | ICD-10-CM | POA: Diagnosis not present

## 2023-04-01 DIAGNOSIS — D649 Anemia, unspecified: Secondary | ICD-10-CM | POA: Diagnosis not present

## 2023-04-01 NOTE — Consult Note (Signed)
[facility] Customer service manager [facility]) Accountable Care Organization (ACO) [facility] Liaison Note  04/01/2023  Katrina Rose 09/10/1961 [id number]  Location: [facility] RN Hospital Liaison screened the patient remotely at [facility].  Insurance: [employer]   Katrina Rose is a 62 y.o. female who is a Primary Care Patient of Kandyce Rud, MD. The patient was screened for readmission hospitalization with noted medium risk score for unplanned readmission risk with 1 IP in 6 months.  The patient was assessed for potential [facility]) Care Management service needs for post hospital transition for care coordination. Review of patient's electronic medical record reveals patient pt was admitted with Acute GI bleed and discharged to SNF. SNF facility will manage pt's ongoing needs at this time.    [facility] Care Management/Population Health does not replace or interfere with any arrangements made by the Inpatient Transition of Care team.   For questions contact:   Elliot Cousin, RN, BSN [facility] Liaison [place]   [facility]  Population Health Office Hours MTWF  8:00 am-6:00 pm Off on Thursday [phone] mobile [phone] [Office toll free line][facility] Office Hours are M-F 8:30 - 5 pm 24 hour nurse advise line [phone] Concierge  [email or web address]

## 2023-04-02 DIAGNOSIS — D649 Anemia, unspecified: Secondary | ICD-10-CM | POA: Diagnosis not present

## 2023-04-02 DIAGNOSIS — E785 Hyperlipidemia, unspecified: Secondary | ICD-10-CM | POA: Diagnosis not present

## 2023-04-02 DIAGNOSIS — F22 Delusional disorders: Secondary | ICD-10-CM | POA: Diagnosis not present

## 2023-04-02 DIAGNOSIS — F03918 Unspecified dementia, unspecified severity, with other behavioral disturbance: Secondary | ICD-10-CM | POA: Diagnosis not present

## 2023-04-02 DIAGNOSIS — F339 Major depressive disorder, recurrent, unspecified: Secondary | ICD-10-CM | POA: Diagnosis not present

## 2023-04-03 DIAGNOSIS — R609 Edema, unspecified: Secondary | ICD-10-CM | POA: Diagnosis not present

## 2023-04-06 DIAGNOSIS — I420 Dilated cardiomyopathy: Secondary | ICD-10-CM | POA: Diagnosis not present

## 2023-04-06 DIAGNOSIS — D649 Anemia, unspecified: Secondary | ICD-10-CM | POA: Diagnosis not present

## 2023-04-06 DIAGNOSIS — I10 Essential (primary) hypertension: Secondary | ICD-10-CM | POA: Diagnosis not present

## 2023-04-06 DIAGNOSIS — Z20828 Contact with and (suspected) exposure to other viral communicable diseases: Secondary | ICD-10-CM | POA: Diagnosis not present

## 2023-04-07 DIAGNOSIS — I10 Essential (primary) hypertension: Secondary | ICD-10-CM | POA: Diagnosis not present

## 2023-04-07 DIAGNOSIS — R600 Localized edema: Secondary | ICD-10-CM | POA: Diagnosis not present

## 2023-04-08 DIAGNOSIS — K219 Gastro-esophageal reflux disease without esophagitis: Secondary | ICD-10-CM | POA: Diagnosis not present

## 2023-04-09 ENCOUNTER — Non-Acute Institutional Stay: Payer: Medicare HMO | Admitting: Nurse Practitioner

## 2023-04-09 ENCOUNTER — Encounter: Payer: Self-pay | Admitting: Nurse Practitioner

## 2023-04-09 VITALS — BP 101/67 | HR 78 | Temp 98.7°F | Resp 18 | Wt 131.6 lb

## 2023-04-09 DIAGNOSIS — I5022 Chronic systolic (congestive) heart failure: Secondary | ICD-10-CM

## 2023-04-09 DIAGNOSIS — Z515 Encounter for palliative care: Secondary | ICD-10-CM

## 2023-04-09 DIAGNOSIS — F03918 Unspecified dementia, unspecified severity, with other behavioral disturbance: Secondary | ICD-10-CM

## 2023-04-09 DIAGNOSIS — R0602 Shortness of breath: Secondary | ICD-10-CM

## 2023-04-09 NOTE — Progress Notes (Signed)
Therapist, nutritional Palliative Care Consult Note Telephone: [phone]  Fax: [phone]   Date of encounter: 04/09/23 4:19 PM PATIENT NAME: Katrina Rose [address] [place] [address]   [phone] (home)  DOB: 1961-02-03 MRN: [id number] PRIMARY CARE PROVIDER:    [facility]  RESPONSIBLE PARTY:    Contact Information     Name Relation Home Work [place] Daughter [phone]     Malvin Johns   [phone]      I met face to face with patient in facility. Palliative Care was asked to follow this patient by consultation request of [facility] to address advance care planning and complex medical decision making. This is the initial visit.               ASSESSMENT AND PLAN / RECOMMENDATIONS:  Symptom Management/Plan: 1. Advance Care Planning;  DNR 2. Goals of Care: Goals include to maximize quality of life and symptom management. Our advance care planning conversation included a discussion about:    The value and importance of advance care planning  Exploration of personal, cultural or spiritual beliefs that might influence medical decisions  Exploration of goals of care in the event of a sudden injury or illness  Identification and preparation of a healthcare agent  Review and updating or creation of an advance directive document. 3. Palliative care encounter; Palliative care encounter; Palliative medicine team will continue to support patient, patient's family, and medical team. Visit consisted of counseling and education dealing with the complex and emotionally intense issues of symptom management and palliative care in the setting of serious and potentially life-threatening illness  4. Shortness of breath secondary to CHF/CM currently stable, continue to monitor weights, edema. Continue current medication regimen.   5. Alzheimer's dementia progressive as she has transitioned to  LTC, encourage mobility, socialization, following disease progression, psych involved with behaviors, monitor moods.  Follow up Palliative Care Visit: Palliative care will continue to follow for complex medical decision making, advance care planning, and clarification of goals. Return 2 to 8 weeks or prn.  I spent 52 minutes providing this consultation. More than 50% of the time in this consultation was spent in counseling and care coordination. PPS: 50%  Chief Complaint: Initial PC f/u visit further discussion monitor trends of appetite, weights, monitor for functional, cognitive decline with chronic disease progression, assess any active symptoms, supportive role.  HISTORY OF PRESENT ILLNESS:  Katrina Rose is a 62 y.o. year old female  with multiple medical problems including chronic systolic CHF, cardiomyopathy (EF 30 to 35% 07/2021), BBB, AICD, anemia, Alzheimer's dementia, CKD 3a, anemia, IDA, chronic pancreatitis, HTN. Hospitalized 03/21/2023 to 03/27/2023 for anemia secondary to GI bleed, symptomatic anemia, syncope with diarrhea, tarry stools. She did require2 units to be transfused. EGD did not show significant abnormality. AKI on CKD stage III, on sodium bicarb tabs with improvement of creatinine down to 1.02. Imaging showed chronic pancreatitis on creon. Chronic systolic CHF, euvolemic, ICD turned of per patient request by cardiology. Dementia with behaviors restarted seroquel and depakote discontinued. Katrina Rose was stabilized and d/c to [facility]. Katrina Rose is mobile, able to assist with dressing, bathing, feeding herself with decreased appetite overall. Katrina Rose is a DNR. At present Katrina Rose is sitting in the chair in her room. We talked about purpose of pc visit, ros, functional abilities, past medical hx, social and family hx reviewed as much as Katrina Rose is  able. We talked about recent hospitalization, residing at facility LTC currently. We talked about challenges and she continues to hop  to return home. We talked about appetite, foods she enjoys. We talked about activities, continuing to participate in facility activities. We talked about role pc in poc, medications, goc reviewed. Will continue to monitor, follow, updated staff, sw.  History obtained from review of EMR, discussion with primary team, and interview with family, facility staff/caregiver and/or Katrina. Rose.  I reviewed available labs, medications, imaging, studies and related documents from the EMR.  Records reviewed and summarized above.   Physical Exam: General: pleasant female ENMT: oral mucous membranes moist CV: S1S2, RRR Pulmonary: Breath sounds clear Skin: warm and dry Neuro:  no generalized weakness,  + cognitive impairment Psych: flat affect CURRENT PROBLEM LIST:  Patient Active Problem List   Diagnosis Date Noted   Chronic pancreatitis (HCC) 03/23/2023   Metabolic acidosis 03/22/2023   Symptomatic anemia 03/21/2023   Anemia due to gastrointestinal blood loss, suspect chronic 03/21/2023   Dementia with behavioral disturbance (HCC) 03/21/2023   Hypertension 03/21/2023   Acute renal failure superimposed on stage 3a chronic kidney disease (HCC) 03/21/2023   Hypotension 03/21/2023   IDA (iron deficiency anemia) 03/21/2023   AICD (automatic cardioverter/defibrillator) present 03/21/2023   LBBB (left bundle branch block) 03/21/2023   History of Clostridioides difficile infection 2017 03/21/2023   Syncope 03/21/2023   DAT (dementia of Alzheimer type) (HCC) 07/30/2022   C. difficile diarrhea 07/20/2016   Chronic systolic CHF (congestive heart failure), NYHA class 3 (HCC) 09/07/2014   Cardiomyopathy (HCC) 04/06/2014   PAST MEDICAL HISTORY:  Active Ambulatory Problems    Diagnosis Date Noted   C. difficile diarrhea 07/20/2016   Symptomatic anemia 03/21/2023   Anemia due to gastrointestinal blood loss, suspect chronic 03/21/2023   Dementia with behavioral disturbance (HCC) 03/21/2023   Hypertension  03/21/2023   Acute renal failure superimposed on stage 3a chronic kidney disease (HCC) 03/21/2023   Hypotension 03/21/2023   Chronic systolic CHF (congestive heart failure), NYHA class 3 (HCC) 09/07/2014   Cardiomyopathy (HCC) 04/06/2014   IDA (iron deficiency anemia) 03/21/2023   AICD (automatic cardioverter/defibrillator) present 03/21/2023   LBBB (left bundle branch block) 03/21/2023   History of Clostridioides difficile infection 2017 03/21/2023   DAT (dementia of Alzheimer type) (HCC) 07/30/2022   Syncope 03/21/2023   Metabolic acidosis 03/22/2023   Chronic pancreatitis (HCC) 03/23/2023   Resolved Ambulatory Problems    Diagnosis Date Noted   No Resolved Ambulatory Problems   Past Medical History:  Diagnosis Date   CHF (congestive heart failure) (HCC)    Dementia (HCC)    GERD (gastroesophageal reflux disease)    Insomnia    SOCIAL HX:  Social History   Tobacco Use   Smoking status: Some Days    Types: Cigarettes   Smokeless tobacco: Never   Tobacco comments:    occasional  Substance Use Topics   Alcohol use: Yes    Comment: rarely   FAMILY HX:  Family History  Problem Relation Age of Onset   CAD Mother    Colon cancer Father       ALLERGIES:  Allergies  Allergen Reactions   Sulfa Antibiotics Hives     PERTINENT MEDICATIONS:  Outpatient Encounter Medications as of 04/09/2023  Medication Sig   atorvastatin (LIPITOR) 20 MG tablet Take 20 mg by mouth daily.   benzonatate (TESSALON) 200 MG capsule Take 200 mg by mouth 3 (three) times daily as needed.  buPROPion (WELLBUTRIN XL) 150 MG 24 hr tablet Take 150 mg by mouth daily.   ferrous sulfate 325 (65 FE) MG tablet Take 325 mg by mouth daily.   lipase/protease/amylase 24000-76000 units CPEP Take 1 capsule (24,000 Units total) by mouth 3 (three) times daily before meals.   pantoprazole (PROTONIX) 40 MG tablet Take 40 mg by mouth daily.   Probiotic Product (PROBIOTIC DAILY PO) Take 1 capsule by mouth daily.    QUEtiapine (SEROQUEL) 100 MG tablet Take 1 tablet (100 mg total) by mouth at bedtime.   rivastigmine (EXELON) 3 MG capsule Take 1 capsule (3 mg total) by mouth 2 (two) times daily.   No facility-administered encounter medications on file as of 04/09/2023.   Thank you for the opportunity to participate in the care of Katrina. [place].  The palliative care team will continue to follow. Please call our office at [phone] if we can be of additional assistance.   Yatziri Wainwright Z Christoher Drudge, NP ,

## 2023-04-13 DIAGNOSIS — Z20828 Contact with and (suspected) exposure to other viral communicable diseases: Secondary | ICD-10-CM | POA: Diagnosis not present

## 2023-04-14 DIAGNOSIS — E785 Hyperlipidemia, unspecified: Secondary | ICD-10-CM | POA: Diagnosis not present

## 2023-04-15 DIAGNOSIS — R630 Anorexia: Secondary | ICD-10-CM | POA: Diagnosis not present

## 2023-04-16 DIAGNOSIS — E785 Hyperlipidemia, unspecified: Secondary | ICD-10-CM | POA: Diagnosis not present

## 2023-04-20 DIAGNOSIS — F22 Delusional disorders: Secondary | ICD-10-CM | POA: Diagnosis not present

## 2023-04-20 DIAGNOSIS — F331 Major depressive disorder, recurrent, moderate: Secondary | ICD-10-CM | POA: Diagnosis not present

## 2023-04-20 DIAGNOSIS — Z20828 Contact with and (suspected) exposure to other viral communicable diseases: Secondary | ICD-10-CM | POA: Diagnosis not present

## 2023-04-20 DIAGNOSIS — G301 Alzheimer's disease with late onset: Secondary | ICD-10-CM | POA: Diagnosis not present

## 2023-04-20 DIAGNOSIS — G8929 Other chronic pain: Secondary | ICD-10-CM | POA: Diagnosis not present

## 2023-04-20 DIAGNOSIS — G4700 Insomnia, unspecified: Secondary | ICD-10-CM | POA: Diagnosis not present

## 2023-04-27 DIAGNOSIS — Z20828 Contact with and (suspected) exposure to other viral communicable diseases: Secondary | ICD-10-CM | POA: Diagnosis not present

## 2023-05-01 DIAGNOSIS — E785 Hyperlipidemia, unspecified: Secondary | ICD-10-CM | POA: Diagnosis not present

## 2023-05-01 DIAGNOSIS — G894 Chronic pain syndrome: Secondary | ICD-10-CM | POA: Diagnosis not present

## 2023-05-05 DIAGNOSIS — K219 Gastro-esophageal reflux disease without esophagitis: Secondary | ICD-10-CM | POA: Diagnosis not present

## 2023-05-05 DIAGNOSIS — I5022 Chronic systolic (congestive) heart failure: Secondary | ICD-10-CM | POA: Diagnosis not present

## 2023-05-05 DIAGNOSIS — I130 Hypertensive heart and chronic kidney disease with heart failure and stage 1 through stage 4 chronic kidney disease, or unspecified chronic kidney disease: Secondary | ICD-10-CM | POA: Diagnosis not present

## 2023-05-05 DIAGNOSIS — G301 Alzheimer's disease with late onset: Secondary | ICD-10-CM | POA: Diagnosis not present

## 2023-05-05 DIAGNOSIS — Z950 Presence of cardiac pacemaker: Secondary | ICD-10-CM | POA: Diagnosis not present

## 2023-05-05 DIAGNOSIS — Z20828 Contact with and (suspected) exposure to other viral communicable diseases: Secondary | ICD-10-CM | POA: Diagnosis not present

## 2023-05-05 DIAGNOSIS — D509 Iron deficiency anemia, unspecified: Secondary | ICD-10-CM | POA: Diagnosis not present

## 2023-05-05 DIAGNOSIS — G4700 Insomnia, unspecified: Secondary | ICD-10-CM | POA: Diagnosis not present

## 2023-05-05 DIAGNOSIS — K861 Other chronic pancreatitis: Secondary | ICD-10-CM | POA: Diagnosis not present

## 2023-05-05 DIAGNOSIS — F331 Major depressive disorder, recurrent, moderate: Secondary | ICD-10-CM | POA: Diagnosis not present

## 2023-05-12 DIAGNOSIS — Z20828 Contact with and (suspected) exposure to other viral communicable diseases: Secondary | ICD-10-CM | POA: Diagnosis not present

## 2023-05-13 DIAGNOSIS — F5105 Insomnia due to other mental disorder: Secondary | ICD-10-CM | POA: Diagnosis not present

## 2023-05-13 DIAGNOSIS — G309 Alzheimer's disease, unspecified: Secondary | ICD-10-CM | POA: Diagnosis not present

## 2023-05-13 DIAGNOSIS — F331 Major depressive disorder, recurrent, moderate: Secondary | ICD-10-CM | POA: Diagnosis not present

## 2023-05-13 DIAGNOSIS — F22 Delusional disorders: Secondary | ICD-10-CM | POA: Diagnosis not present

## 2023-05-13 DIAGNOSIS — G894 Chronic pain syndrome: Secondary | ICD-10-CM | POA: Diagnosis not present

## 2023-05-13 DIAGNOSIS — Z79899 Other long term (current) drug therapy: Secondary | ICD-10-CM | POA: Diagnosis not present

## 2023-05-13 DIAGNOSIS — E559 Vitamin D deficiency, unspecified: Secondary | ICD-10-CM | POA: Diagnosis not present

## 2023-05-19 DIAGNOSIS — E876 Hypokalemia: Secondary | ICD-10-CM | POA: Diagnosis not present

## 2023-05-19 DIAGNOSIS — Z20828 Contact with and (suspected) exposure to other viral communicable diseases: Secondary | ICD-10-CM | POA: Diagnosis not present

## 2023-05-19 DIAGNOSIS — D509 Iron deficiency anemia, unspecified: Secondary | ICD-10-CM | POA: Diagnosis not present

## 2023-05-19 DIAGNOSIS — E785 Hyperlipidemia, unspecified: Secondary | ICD-10-CM | POA: Diagnosis not present

## 2023-05-19 DIAGNOSIS — E559 Vitamin D deficiency, unspecified: Secondary | ICD-10-CM | POA: Diagnosis not present

## 2023-05-19 DIAGNOSIS — G309 Alzheimer's disease, unspecified: Secondary | ICD-10-CM | POA: Diagnosis not present

## 2023-05-19 DIAGNOSIS — I130 Hypertensive heart and chronic kidney disease with heart failure and stage 1 through stage 4 chronic kidney disease, or unspecified chronic kidney disease: Secondary | ICD-10-CM | POA: Diagnosis not present

## 2023-05-19 DIAGNOSIS — K861 Other chronic pancreatitis: Secondary | ICD-10-CM | POA: Diagnosis not present

## 2023-05-19 DIAGNOSIS — F02C0 Dementia in other diseases classified elsewhere, severe, without behavioral disturbance, psychotic disturbance, mood disturbance, and anxiety: Secondary | ICD-10-CM | POA: Diagnosis not present

## 2023-05-19 DIAGNOSIS — I5022 Chronic systolic (congestive) heart failure: Secondary | ICD-10-CM | POA: Diagnosis not present

## 2023-05-22 DIAGNOSIS — M79605 Pain in left leg: Secondary | ICD-10-CM | POA: Diagnosis not present

## 2023-05-22 DIAGNOSIS — R2242 Localized swelling, mass and lump, left lower limb: Secondary | ICD-10-CM | POA: Diagnosis not present

## 2023-05-23 DIAGNOSIS — Z79891 Long term (current) use of opiate analgesic: Secondary | ICD-10-CM | POA: Diagnosis not present

## 2023-05-23 DIAGNOSIS — R609 Edema, unspecified: Secondary | ICD-10-CM | POA: Diagnosis not present

## 2023-05-23 DIAGNOSIS — L03116 Cellulitis of left lower limb: Secondary | ICD-10-CM | POA: Diagnosis not present

## 2023-05-23 DIAGNOSIS — Z792 Long term (current) use of antibiotics: Secondary | ICD-10-CM | POA: Diagnosis not present

## 2023-05-26 DIAGNOSIS — Z20828 Contact with and (suspected) exposure to other viral communicable diseases: Secondary | ICD-10-CM | POA: Diagnosis not present

## 2023-05-27 DIAGNOSIS — Z79891 Long term (current) use of opiate analgesic: Secondary | ICD-10-CM | POA: Diagnosis not present

## 2023-05-27 DIAGNOSIS — I739 Peripheral vascular disease, unspecified: Secondary | ICD-10-CM | POA: Diagnosis not present

## 2023-05-27 DIAGNOSIS — F02C0 Dementia in other diseases classified elsewhere, severe, without behavioral disturbance, psychotic disturbance, mood disturbance, and anxiety: Secondary | ICD-10-CM | POA: Diagnosis not present

## 2023-05-27 DIAGNOSIS — R600 Localized edema: Secondary | ICD-10-CM | POA: Diagnosis not present

## 2023-05-27 DIAGNOSIS — R609 Edema, unspecified: Secondary | ICD-10-CM | POA: Diagnosis not present

## 2023-05-27 DIAGNOSIS — L03116 Cellulitis of left lower limb: Secondary | ICD-10-CM | POA: Diagnosis not present

## 2023-05-27 DIAGNOSIS — G301 Alzheimer's disease with late onset: Secondary | ICD-10-CM | POA: Diagnosis not present

## 2023-05-27 DIAGNOSIS — Z792 Long term (current) use of antibiotics: Secondary | ICD-10-CM | POA: Diagnosis not present

## 2023-05-28 DIAGNOSIS — Z792 Long term (current) use of antibiotics: Secondary | ICD-10-CM | POA: Diagnosis not present

## 2023-05-28 DIAGNOSIS — Z79891 Long term (current) use of opiate analgesic: Secondary | ICD-10-CM | POA: Diagnosis not present

## 2023-05-28 DIAGNOSIS — L03116 Cellulitis of left lower limb: Secondary | ICD-10-CM | POA: Diagnosis not present

## 2023-05-28 DIAGNOSIS — R609 Edema, unspecified: Secondary | ICD-10-CM | POA: Diagnosis not present

## 2023-05-29 DIAGNOSIS — Z792 Long term (current) use of antibiotics: Secondary | ICD-10-CM | POA: Diagnosis not present

## 2023-05-29 DIAGNOSIS — R609 Edema, unspecified: Secondary | ICD-10-CM | POA: Diagnosis not present

## 2023-05-29 DIAGNOSIS — Z79891 Long term (current) use of opiate analgesic: Secondary | ICD-10-CM | POA: Diagnosis not present

## 2023-05-29 DIAGNOSIS — L03116 Cellulitis of left lower limb: Secondary | ICD-10-CM | POA: Diagnosis not present

## 2023-06-01 DIAGNOSIS — Z792 Long term (current) use of antibiotics: Secondary | ICD-10-CM | POA: Diagnosis not present

## 2023-06-01 DIAGNOSIS — R609 Edema, unspecified: Secondary | ICD-10-CM | POA: Diagnosis not present

## 2023-06-01 DIAGNOSIS — F331 Major depressive disorder, recurrent, moderate: Secondary | ICD-10-CM | POA: Diagnosis not present

## 2023-06-01 DIAGNOSIS — Z79891 Long term (current) use of opiate analgesic: Secondary | ICD-10-CM | POA: Diagnosis not present

## 2023-06-01 DIAGNOSIS — G301 Alzheimer's disease with late onset: Secondary | ICD-10-CM | POA: Diagnosis not present

## 2023-06-01 DIAGNOSIS — L03116 Cellulitis of left lower limb: Secondary | ICD-10-CM | POA: Diagnosis not present

## 2023-06-02 DIAGNOSIS — R609 Edema, unspecified: Secondary | ICD-10-CM | POA: Diagnosis not present

## 2023-06-02 DIAGNOSIS — Z79891 Long term (current) use of opiate analgesic: Secondary | ICD-10-CM | POA: Diagnosis not present

## 2023-06-02 DIAGNOSIS — F331 Major depressive disorder, recurrent, moderate: Secondary | ICD-10-CM | POA: Diagnosis not present

## 2023-06-02 DIAGNOSIS — I130 Hypertensive heart and chronic kidney disease with heart failure and stage 1 through stage 4 chronic kidney disease, or unspecified chronic kidney disease: Secondary | ICD-10-CM | POA: Diagnosis not present

## 2023-06-02 DIAGNOSIS — I739 Peripheral vascular disease, unspecified: Secondary | ICD-10-CM | POA: Diagnosis not present

## 2023-06-02 DIAGNOSIS — K861 Other chronic pancreatitis: Secondary | ICD-10-CM | POA: Diagnosis not present

## 2023-06-02 DIAGNOSIS — Z792 Long term (current) use of antibiotics: Secondary | ICD-10-CM | POA: Diagnosis not present

## 2023-06-02 DIAGNOSIS — F02C0 Dementia in other diseases classified elsewhere, severe, without behavioral disturbance, psychotic disturbance, mood disturbance, and anxiety: Secondary | ICD-10-CM | POA: Diagnosis not present

## 2023-06-02 DIAGNOSIS — R600 Localized edema: Secondary | ICD-10-CM | POA: Diagnosis not present

## 2023-06-02 DIAGNOSIS — I5022 Chronic systolic (congestive) heart failure: Secondary | ICD-10-CM | POA: Diagnosis not present

## 2023-06-02 DIAGNOSIS — L03116 Cellulitis of left lower limb: Secondary | ICD-10-CM | POA: Diagnosis not present

## 2023-06-02 DIAGNOSIS — G301 Alzheimer's disease with late onset: Secondary | ICD-10-CM | POA: Diagnosis not present

## 2023-06-05 DIAGNOSIS — R609 Edema, unspecified: Secondary | ICD-10-CM | POA: Diagnosis not present

## 2023-06-05 DIAGNOSIS — L03116 Cellulitis of left lower limb: Secondary | ICD-10-CM | POA: Diagnosis not present

## 2023-06-05 DIAGNOSIS — Z79891 Long term (current) use of opiate analgesic: Secondary | ICD-10-CM | POA: Diagnosis not present

## 2023-06-05 DIAGNOSIS — Z792 Long term (current) use of antibiotics: Secondary | ICD-10-CM | POA: Diagnosis not present

## 2023-06-12 ENCOUNTER — Encounter (INDEPENDENT_AMBULATORY_CARE_PROVIDER_SITE_OTHER): Payer: Self-pay

## 2023-06-25 ENCOUNTER — Other Ambulatory Visit (INDEPENDENT_AMBULATORY_CARE_PROVIDER_SITE_OTHER): Payer: Self-pay | Admitting: Nurse Practitioner

## 2023-06-25 DIAGNOSIS — R600 Localized edema: Secondary | ICD-10-CM

## 2023-06-27 ENCOUNTER — Encounter (INDEPENDENT_AMBULATORY_CARE_PROVIDER_SITE_OTHER): Payer: Self-pay

## 2023-07-01 ENCOUNTER — Ambulatory Visit (INDEPENDENT_AMBULATORY_CARE_PROVIDER_SITE_OTHER): Payer: Medicare Other | Admitting: Nurse Practitioner

## 2023-07-01 ENCOUNTER — Encounter (INDEPENDENT_AMBULATORY_CARE_PROVIDER_SITE_OTHER): Payer: Self-pay | Admitting: Nurse Practitioner

## 2023-07-01 ENCOUNTER — Ambulatory Visit (INDEPENDENT_AMBULATORY_CARE_PROVIDER_SITE_OTHER): Payer: Medicare Other

## 2023-07-01 VITALS — BP 110/72 | HR 94 | Resp 18 | Ht 61.0 in | Wt 122.4 lb

## 2023-07-01 DIAGNOSIS — R600 Localized edema: Secondary | ICD-10-CM

## 2023-07-01 DIAGNOSIS — I10 Essential (primary) hypertension: Secondary | ICD-10-CM

## 2023-07-01 DIAGNOSIS — I5022 Chronic systolic (congestive) heart failure: Secondary | ICD-10-CM

## 2023-07-01 DIAGNOSIS — I890 Lymphedema, not elsewhere classified: Secondary | ICD-10-CM

## 2023-07-01 NOTE — Progress Notes (Signed)
Subjective:    Patient ID: Katrina Rose, female    DOB: 1961/01/19, 62 y.o.   MRN: [id number] Chief Complaint  Patient presents with   New Patient (Initial Visit)    Np consult with DVT unleateral DVT    The patient has a 62 year old female presents today for evaluation of ongoing left lower extremity edema and redness.  The patient has had some swelling and left lower extremity edema ongoing for an extended time but recently it seems to have worsened.  She has had an episode of cellulitis which has been treated.  Since this swelling has occurred she has been wearing TED hose about a week or so ago.  The swelling has improved with use of the tablets.  The patient herself is a relatively poor historian due to dementia however she is able to answer for immediate questions about her wellbeing.  Her sister is on the phone to help with much of her history.  She also has a history of heart failure and chronic kidney disease.  She has been on Lasix which has been increased recently without significant change in her edema.  She has previously had Dopplers with no noted DVT.  Today, the patient underwent noninvasive studies which show no evidence of DVT or superficial thrombophlebitis in the left lower extremity.  There does not appear to be any reflux noted in the great saphenous vein or evidence of chronic venous insufficiency.    Review of Systems     Objective:   Physical Exam  BP 110/72 (BP Location: Right Arm)   Pulse 94   Resp 18   Ht 5\' 1"  (1.549 m)   Wt 122 lb 6.4 oz (55.5 kg)   BMI 23.13 kg/m   Past Medical History:  Diagnosis Date   AICD (automatic cardioverter/defibrillator) present    CHF (congestive heart failure) (HCC)    Dementia (HCC)    GERD (gastroesophageal reflux disease)    Hypertension    IDA (iron deficiency anemia)    Insomnia    LBBB (left bundle branch block)     Social History   Socioeconomic History   Marital status: Single    Spouse name: Not on  file   Number of children: Not on file   Years of education: Not on file   Highest education level: Not on file  Occupational History   Not on file  Tobacco Use   Smoking status: Some Days    Types: Cigarettes   Smokeless tobacco: Never   Tobacco comments:    occasional  Substance and Sexual Activity   Alcohol use: Yes    Comment: rarely   Drug use: No   Sexual activity: Not on file  Other Topics Concern   Not on file  Social History Narrative   Not on file   Social Determinants of Health   Financial Resource Strain: Not on file  Food Insecurity: No Food Insecurity (03/22/2023)   Hunger Vital Sign    Worried About Running Out of Food in the Last Year: Never true    Ran Out of Food in the Last Year: Never true  Transportation Needs: No Transportation Needs (03/22/2023)   PRAPARE - Administrator, Civil Service (Medical): No    Lack of Transportation (Non-Medical): No  Physical Activity: Not on file  Stress: Not on file  Social Connections: Not on file  Intimate Partner Violence: Not At Risk (03/22/2023)   Humiliation, Afraid, Rape, and Kick questionnaire  Fear of Current or Ex-Partner: No    Emotionally Abused: No    Physically Abused: No    Sexually Abused: No    Past Surgical History:  Procedure Laterality Date   ANKLE SURGERY Left    CARDIAC CATHETERIZATION     COLONOSCOPY     COLONOSCOPY WITH PROPOFOL N/A 10/07/2021   Procedure: COLONOSCOPY WITH PROPOFOL;  Surgeon: Jaynie Collins, DO;  Location: [facility] ENDOSCOPY;  Service: Gastroenterology;  Laterality: N/A;   debridement and closure sternal wound     DILATION AND CURETTAGE OF UTERUS     ESOPHAGOGASTRODUODENOSCOPY     ESOPHAGOGASTRODUODENOSCOPY (EGD) WITH PROPOFOL N/A 10/07/2021   Procedure: ESOPHAGOGASTRODUODENOSCOPY (EGD) WITH PROPOFOL;  Surgeon: Jaynie Collins, DO;  Location: [facility] ENDOSCOPY;  Service: Gastroenterology;  Laterality: N/A;   ESOPHAGOGASTRODUODENOSCOPY (EGD) WITH PROPOFOL  N/A 03/22/2023   Procedure: ESOPHAGOGASTRODUODENOSCOPY (EGD) WITH PROPOFOL;  Surgeon: [name], MD;  Location: [facility] ENDOSCOPY;  Service: Gastroenterology;  Laterality: N/A;   ICD GENERATOR CHANGEOUT N/A 08/01/2021   Procedure: ICD GENERATOR CHANGEOUT;  Surgeon: Marcina Millard, MD;  Location: [facility] INVASIVE CV LAB;  Service: Cardiovascular;  Laterality: N/A;   pacermaker/ defibrilater   2010   TUBAL LIGATION      Family History  Problem Relation Age of Onset   CAD Mother    Colon cancer Father     Allergies  Allergen Reactions   Sulfa Antibiotics Hives       Latest Ref Rng & Units 03/25/2023    4:16 AM 03/23/2023    4:56 AM 03/22/2023    4:42 PM  CBC  WBC 4.0 - 10.5 K/uL 5.1     Hemoglobin 12.0 - 15.0 g/dL 73.7  10.6  26.9   Hematocrit 36.0 - 46.0 % 35.1     Platelets 150 - 400 K/uL 216         CMP     Component Value Date/Time   NA 135 03/25/2023 0416   NA 139 09/21/2014 1017   K 3.8 03/25/2023 0416   K 3.9 09/21/2014 1017   CL 105 03/25/2023 0416   CL 105 09/21/2014 1017   CO2 23 03/25/2023 0416   CO2 29 09/21/2014 1017   GLUCOSE 116 (H) 03/25/2023 0416   GLUCOSE 81 09/21/2014 1017   BUN 18 03/25/2023 0416   BUN 19 (H) 09/21/2014 1017   CREATININE 1.02 (H) 03/25/2023 0416   CREATININE 0.89 09/21/2014 1017   CALCIUM 8.5 (L) 03/25/2023 0416   CALCIUM 9.5 09/21/2014 1017   PROT 6.7 03/21/2023 1442   PROT 6.6 06/01/2012 0926   ALBUMIN 3.7 03/21/2023 1442   ALBUMIN 3.1 (L) 06/01/2012 0926   AST 14 (L) 03/21/2023 1442   AST 26 06/01/2012 0926   ALT 9 03/21/2023 1442   ALT 15 06/01/2012 0926   ALKPHOS 82 03/21/2023 1442   ALKPHOS 77 06/01/2012 0926   BILITOT 0.5 03/21/2023 1442   BILITOT 0.2 06/01/2012 0926   GFRNONAA >60 03/25/2023 0416   GFRNONAA >60 09/21/2014 1017   GFRNONAA >60 06/01/2012 0926     No results found.     Assessment & Plan:   1. Lymphedema Today the patient had no evidence of venous insufficiency and no evidence of DVT.  I  suspect that in addition to her other underlying medical issues her swelling is related to some underlying lymphedema.  I had a long discussion with the patient, her sister as well as caretakers and regarding conservative therapies.  The patient is currently wearing TED hose  and while helpful that is more so meant for patients who are stationary at that.  They are advised that she will continue to utilize these while she is in bed or if she is more bedbound.  However when the patient is up and active compression socks that are graduated tend to work better.  We recommend 20 to 30 mmHg compression socks to be worn daily place first thing in the morning and not to be slept in.  She should also elevate her lower extremities is much as possible.  Additionally if she can walk for approximately 15 to 20 minutes 3 to 4 days/week this will also help her lower extremity edema.  We will plan on having the patient consistently use these conservative therapy tactics and have her return in 3 months.  At which time if the swelling is still not under decent control we can discuss a lymphedema pump for use.  2. Hypertension, unspecified type Continue antihypertensive medications as already ordered, these medications have been reviewed and there are no changes at this time.  3. Chronic systolic CHF (congestive heart failure), NYHA class 3 (HCC) This is certainly a contributing factor to the patient's lower extremity edema   Current Outpatient Medications on File Prior to Visit  Medication Sig Dispense Refill   acetaminophen (PHARBETOL) 325 MG tablet Take 650 mg by mouth every 6 (six) hours as needed for headache.     ALERTAB 25 MG tablet Take 25 mg by mouth every 6 (six) hours as needed.     alum & mag hydroxide-simeth (GERI-LANTA) 200-200-20 MG/5ML suspension Take 30 mLs by mouth every 6 (six) hours as needed for indigestion or heartburn.     atorvastatin (LIPITOR) 20 MG tablet Take 20 mg by mouth daily.      benzonatate (TESSALON) 200 MG capsule Take 200 mg by mouth 3 (three) times daily as needed for cough.     buPROPion ER (WELLBUTRIN SR) 100 MG 12 hr tablet Take 100 mg by mouth 2 (two) times daily.     Cholecalciferol (VITAMIN D3) 25 MCG (1000 UT) CAPS Take by mouth daily.     ferrous sulfate (FEROSUL) 325 (65 FE) MG tablet Take 325 mg by mouth daily with breakfast.     furosemide (LASIX) 20 MG tablet Take 20 mg by mouth daily.     guaiFENesin (GERI-TUSSIN) 100 MG/5ML liquid Take 15 mLs by mouth every 4 (four) hours as needed for cough or to loosen phlegm.     hydrocortisone cream 1 % Apply 1 Application topically 2 (two) times daily. Apply Daily To Minor Skin/ Irritations Until Healed (Notify Physician If Symptoms Persist Greater Than 3 Days)     Lactobacillus 0.05-0.05 MG TABS      loperamide (ANTI-DIARRHEAL) 2 MG tablet Take 2 mg by mouth 4 (four) times daily as needed for diarrhea or loose stools.     magnesium hydroxide (MILK OF MAGNESIA) 400 MG/5ML suspension Take 30 mLs by mouth daily as needed for mild constipation.     Melatonin 5 MG CHEW      Pancrelipase, Lip-Prot-Amyl, (CREON) 24000-76000 units CPEP Take 24,000 Units by mouth 3 (three) times daily before meals.     pantoprazole (PROTONIX) 40 MG tablet Take 40 mg by mouth daily.     potassium chloride (MICRO-K) 10 MEQ CR capsule Take 10 mEq by mouth daily.     QUEtiapine (SEROQUEL) 100 MG tablet Take 1 tablet (100 mg total) by mouth at bedtime.     rivastigmine (  EXELON) 3 MG capsule Take 3 mg by mouth daily.     sodium bicarbonate 650 MG tablet Take 650 mg by mouth 2 (two) times daily.     No current facility-administered medications on file prior to visit.    There are no Patient Instructions on file for this visit. No follow-ups on file.   Georgiana Spinner, NP

## 2023-07-03 IMAGING — CT CT HEAD W/O CM
4 series · 16 of 47 positions shown, 18 images · non-contrast
Comparison: None.

CLINICAL DATA: Head trauma

EXAM:
CT HEAD WITHOUT CONTRAST
TECHNIQUE: Contiguous axial images were obtained from the base of the skull
through the vertex without intravenous contrast.

[Series 2: head bone · axial · 0.45mm/px · z∈[+1579,+1607]mm · 3 of 72 slices shown]
[im 8/72  bone]
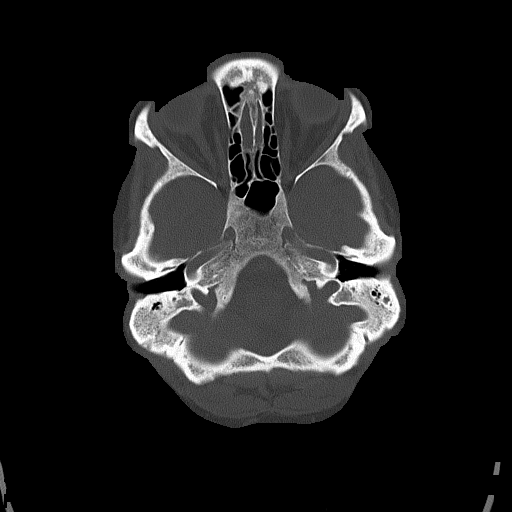
[im 15/72  bone]
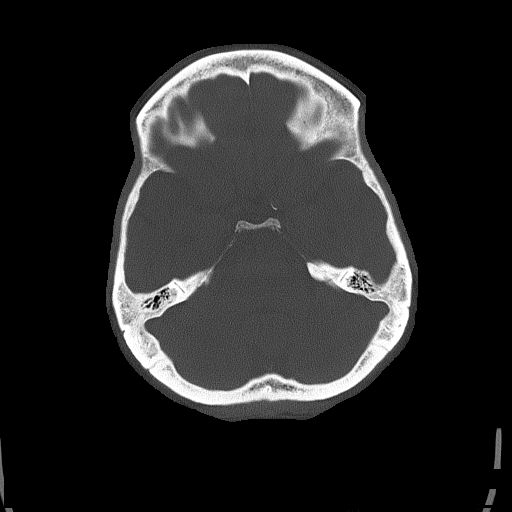
[im 22/72  bone]
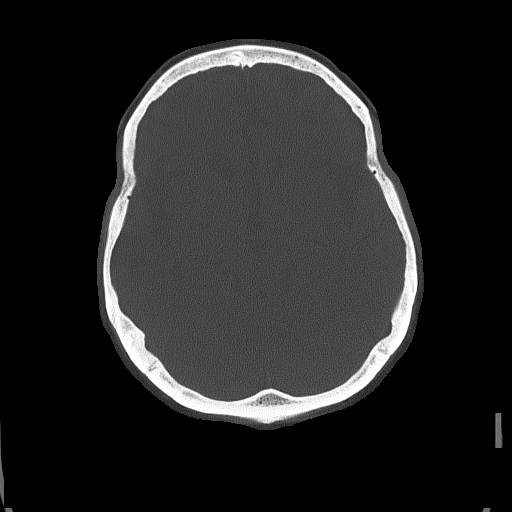

[Series 3: head wo · axial · 0.45mm/px · z∈[+1580,+1685]mm · 7 of 29 slices shown, 9 images]
[im 4/29  brain]
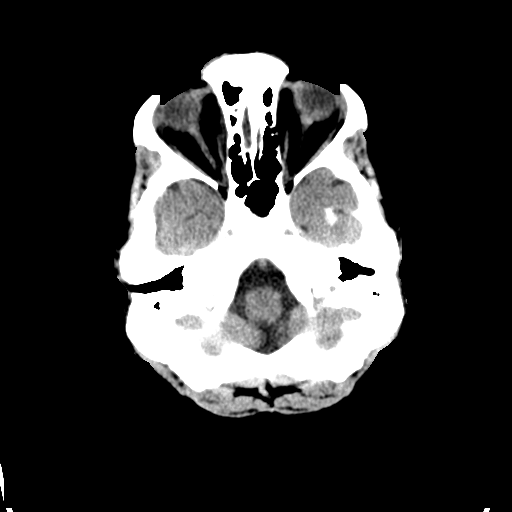
[im 4/29  bone]
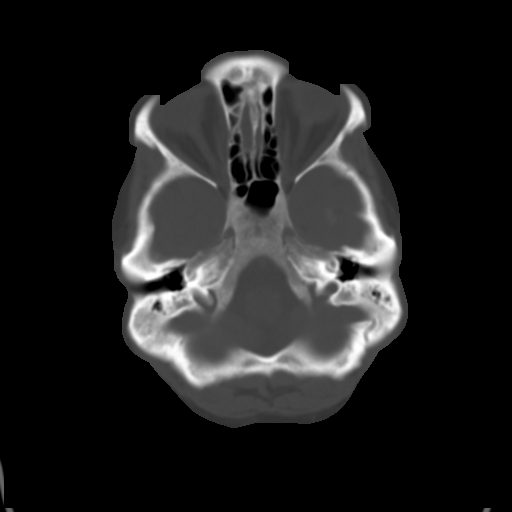
[im 8/29  brain]
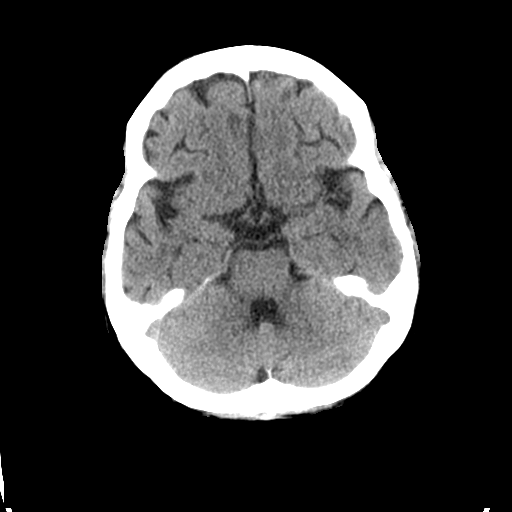
[im 11/29  brain]
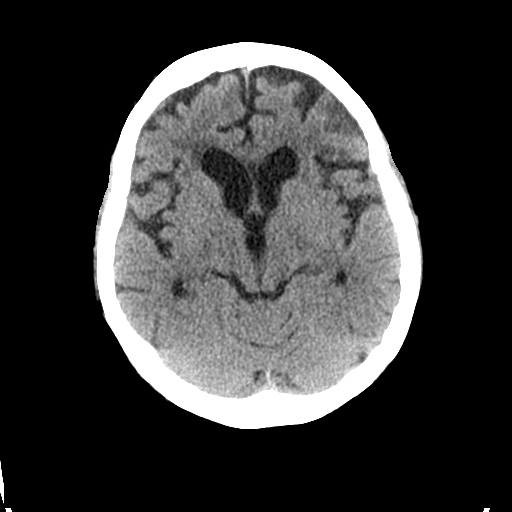
[im 15/29  brain]
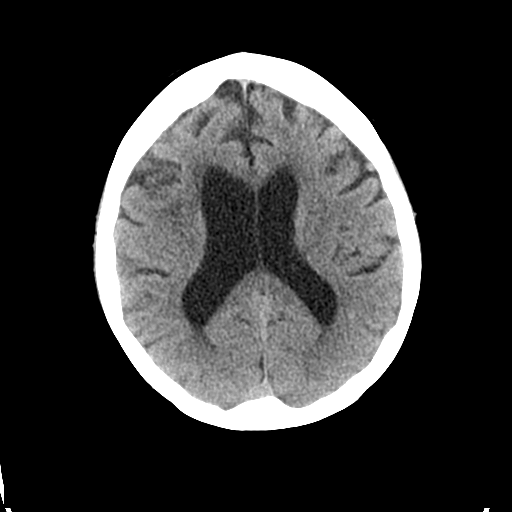
[im 18/29  brain]
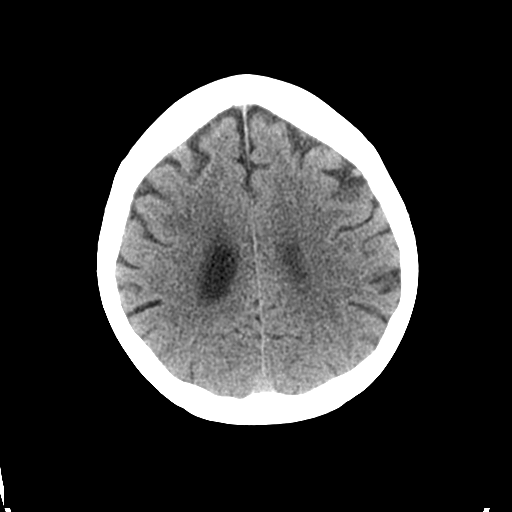
[im 18/29  bone]
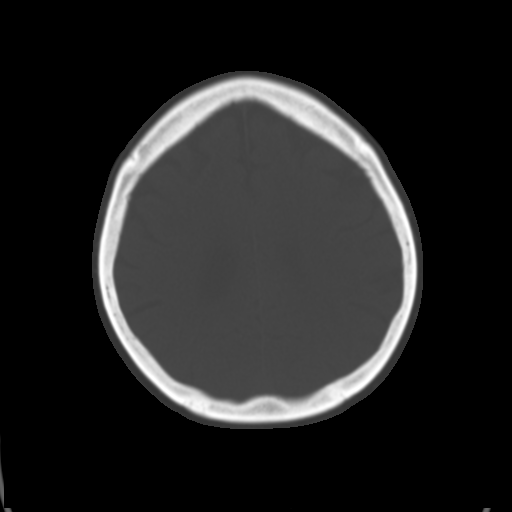
[im 22/29  brain]
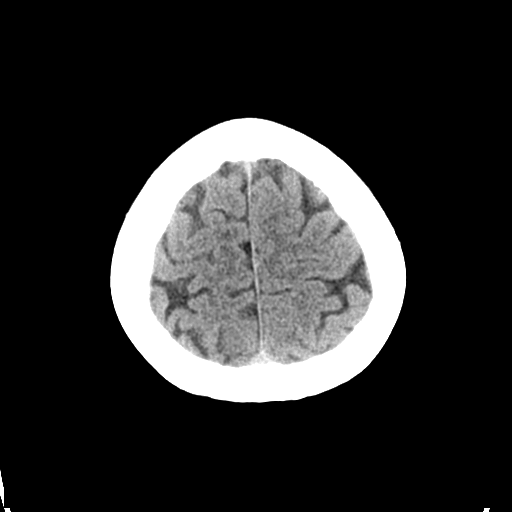
[im 25/29  brain]
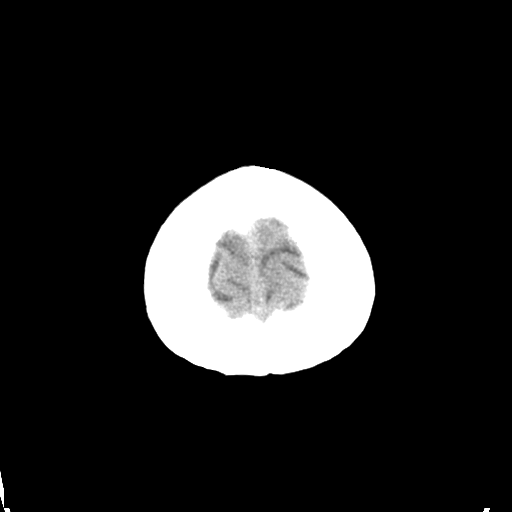

[Series 4: coronal soft tissue · coronal · 0.29mm/px · 3 of 62 slices shown]
[im 21/62  brain]
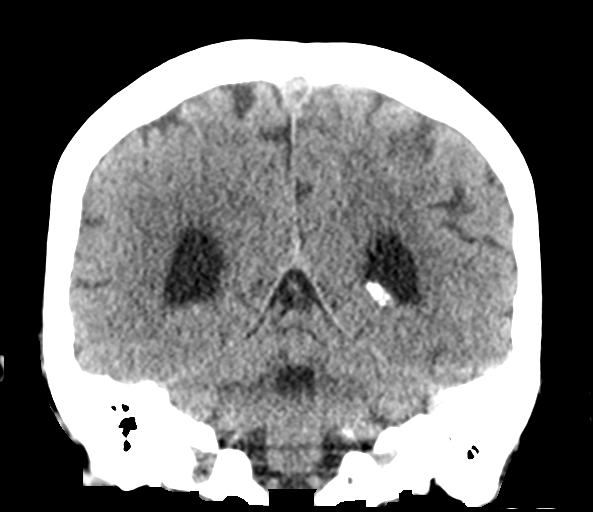
[im 28/62  brain]
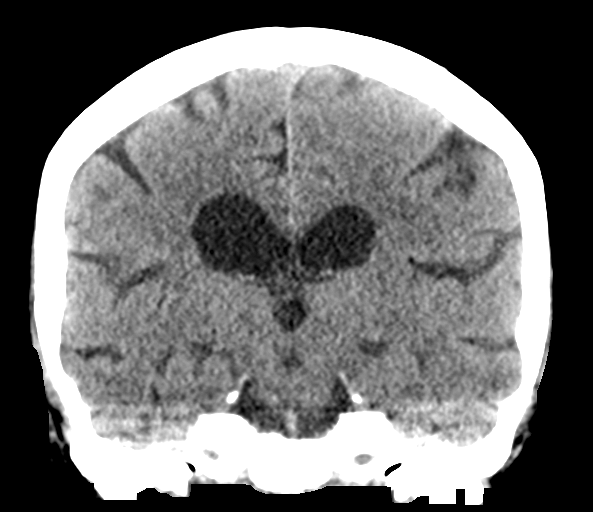
[im 34/62  brain]
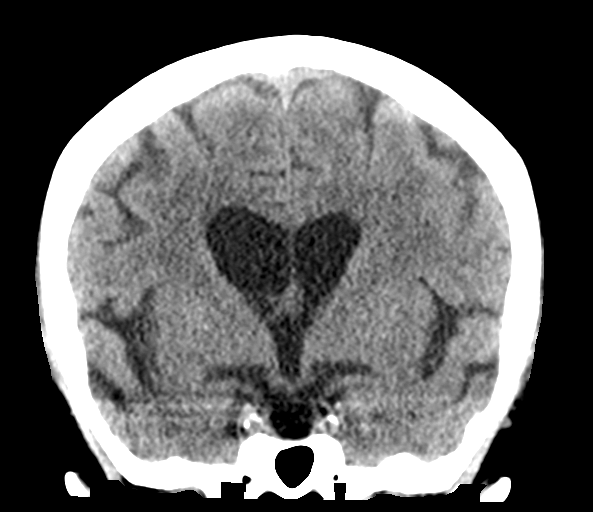

[Series 5: sagittal soft tissue · sagittal · 0.29mm/px · 3 of 57 slices shown]
[im 19/57  brain]
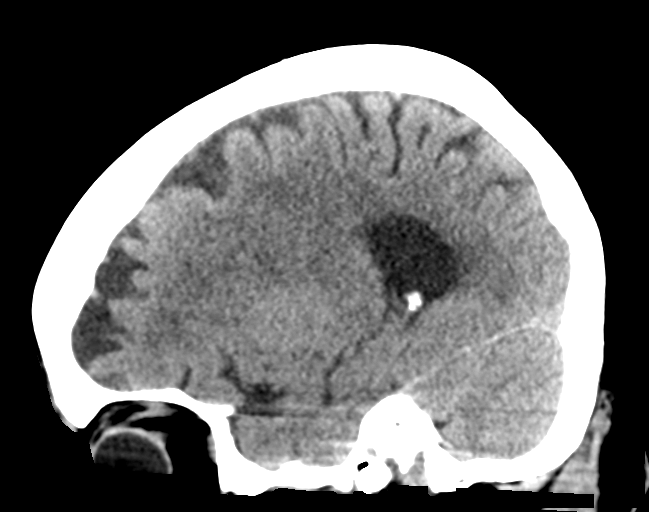
[im 29/57  brain]
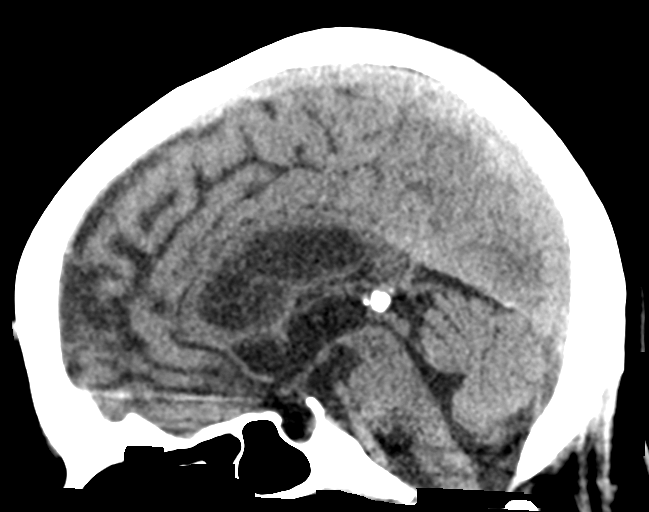
[im 38/57  brain]
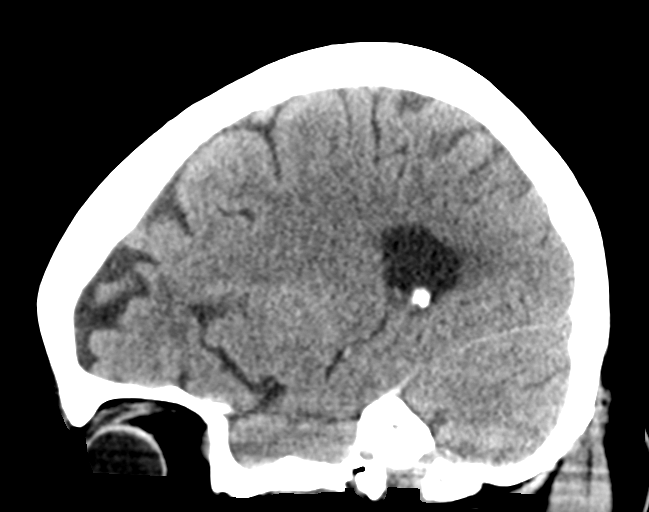

[16 of 47 positions shown; findings below may reference images not displayed]

FINDINGS: Brain: No acute intracranial hemorrhage, mass effect, or herniation.
No extra-axial fluid collections. No evidence of acute territorial
infarct. No hydrocephalus. Patchy hypodensities in the
periventricular and subcortical white matter, likely secondary to
chronic microvascular ischemic changes.

Vascular: No hyperdense vessel or unexpected calcification.

Skull: Normal. Negative for fracture or focal lesion.

Sinuses/Orbits: No acute finding.

Other: None.
IMPRESSION: Chronic changes with no acute intracranial process identified.

## 2023-08-13 ENCOUNTER — Emergency Department: Payer: Medicare Other

## 2023-08-13 ENCOUNTER — Emergency Department
Admission: EM | Admit: 2023-08-13 | Discharge: 2023-08-13 | Disposition: A | Discharge status: Home / Self Care | Payer: Medicare Other | Attending: Emergency Medicine | Admitting: Emergency Medicine

## 2023-08-13 ENCOUNTER — Encounter: Payer: Self-pay | Admitting: Emergency Medicine

## 2023-08-13 DIAGNOSIS — S0181XA Laceration without foreign body of other part of head, initial encounter: Secondary | ICD-10-CM | POA: Diagnosis not present

## 2023-08-13 DIAGNOSIS — Y92003 Bedroom of unspecified non-institutional (private) residence as the place of occurrence of the external cause: Secondary | ICD-10-CM | POA: Diagnosis not present

## 2023-08-13 DIAGNOSIS — Z23 Encounter for immunization: Secondary | ICD-10-CM | POA: Diagnosis not present

## 2023-08-13 DIAGNOSIS — F0390 Unspecified dementia without behavioral disturbance: Secondary | ICD-10-CM | POA: Insufficient documentation

## 2023-08-13 DIAGNOSIS — W06XXXA Fall from bed, initial encounter: Secondary | ICD-10-CM | POA: Diagnosis not present

## 2023-08-13 DIAGNOSIS — S0993XA Unspecified injury of face, initial encounter: Secondary | ICD-10-CM | POA: Diagnosis present

## 2023-08-13 MED ORDER — OXYCODONE HCL 5 MG PO TABS
5.0000 mg | ORAL_TABLET | Freq: Once | ORAL | Status: AC
  Administered 2023-08-13: 5 mg via ORAL
  Filled 2023-08-13: qty 1

## 2023-08-13 MED ORDER — LIDOCAINE HCL (PF) 1 % IJ SOLN
10.0000 mL | Freq: Once | INTRAMUSCULAR | Status: AC
  Administered 2023-08-13: 10 mL
  Filled 2023-08-13: qty 10

## 2023-08-13 MED ORDER — ACETAMINOPHEN 500 MG PO TABS
1000.0000 mg | ORAL_TABLET | Freq: Once | ORAL | Status: AC
  Administered 2023-08-13: 1000 mg via ORAL
  Filled 2023-08-13: qty 2

## 2023-08-13 MED ORDER — TETANUS-DIPHTH-ACELL PERTUSSIS 5-2.5-18.5 LF-MCG/0.5 IM SUSY
0.5000 mL | PREFILLED_SYRINGE | Freq: Once | INTRAMUSCULAR | Status: AC
  Administered 2023-08-13: 0.5 mL via INTRAMUSCULAR
  Filled 2023-08-13: qty 0.5

## 2023-08-13 MED ORDER — BACITRACIN ZINC 500 UNIT/GM EX OINT
TOPICAL_OINTMENT | Freq: Once | CUTANEOUS | Status: AC
  Administered 2023-08-13: 1 via TOPICAL
  Filled 2023-08-13: qty 0.9

## 2023-08-13 MED ORDER — QUETIAPINE FUMARATE 25 MG PO TABS
50.0000 mg | ORAL_TABLET | Freq: Once | ORAL | Status: AC
  Administered 2023-08-13: 50 mg via ORAL
  Filled 2023-08-13: qty 2

## 2023-08-13 NOTE — ED Notes (Signed)
ED Provider at bedside for lac repair 

## 2023-08-13 NOTE — ED Notes (Addendum)
Pt's face cleansed with warm water and washcloth. Pt. Verbalized appreciation. NAD. Denies further need at this time.

## 2023-08-13 NOTE — ED Provider Notes (Signed)
[facility] Provider Note    Event Date/Time   First MD Initiated Contact with Patient 08/13/23 [phone]     (approximate)   History   Laceration   HPI  Katrina Rose is a 62 y.o. female who presents to the ED for evaluation of Laceration   Review of cardiology clinic visit from 1 month ago.  History of nonischemic cardiomyopathy, early onset dementia and living in a memory care unit.  Patient presents with a chin laceration after a fall.  Pleasantly demented and has no complaints.   Physical Exam   Triage Vital Signs: ED Triage Vitals  Encounter Vitals Group     BP      Systolic BP Percentile      Diastolic BP Percentile      Pulse      Resp      Temp      Temp src      SpO2      Weight      Height      Head Circumference      Peak Flow      Pain Score      Pain Loc      Pain Education      Exclude from Growth Chart     Most recent vital signs: Vitals:   08/13/23 0337 08/13/23 0344  BP: 123/84   Pulse: 88   Resp: 18   Temp:  (!) 97.5 F (36.4 C)  SpO2: 98%     General: Awake, no distress.  CV:  Good peripheral perfusion.  Resp:  Normal effort.  Abd:  No distention.  MSK:  No deformity noted.  Neuro:  No focal deficits appreciated. Other:  Approximately 7 cm semilunate laceration into the subcutaneous tissue of the chin as pictured below     ED Results / Procedures / Treatments   Labs (all labs ordered are listed, but only abnormal results are displayed) Labs Reviewed - No data to display  EKG   RADIOLOGY CT head interpreted by me without evidence of acute intracranial pathology CT cervical spine interpreted by me without evidence of fracture or dislocation  Official radiology report(s): CT Cervical Spine Wo Contrast  Result Date: 08/13/2023 CLINICAL DATA:  62 year old female status post fall from bed. Struck chin. EXAM: CT CERVICAL SPINE WITHOUT CONTRAST TECHNIQUE: Multidetector CT imaging of the cervical  spine was performed without intravenous contrast. Multiplanar CT image reconstructions were also generated. RADIATION DOSE REDUCTION: This exam was performed according to the departmental dose-optimization program which includes automated exposure control, adjustment of the mA and/or kV according to patient size and/or use of iterative reconstruction technique. COMPARISON:  Head CT today.  Brain MRI 03/05/2022. FINDINGS: Alignment: Mild straightening of cervical lordosis. Cervicothoracic junction alignment is within normal limits. Bilateral posterior element alignment is within normal limits. Skull base and vertebrae: Bone mineralization is within normal limits. Visualized skull base is intact. No atlanto-occipital dissociation. C1 and C2 appear intact and aligned. No acute osseous abnormality identified. Soft tissues and spinal canal: No prevertebral fluid or swelling. No visible canal hematoma. Mild left cervical carotid calcified atherosclerosis. Negative visible other noncontrast neck soft tissues. Disc levels: Age-appropriate cervical spine degeneration. Maximal disc and endplate degeneration at C5-C6. Upper chest: Partially visible left thoracic inlet cardiac pacer/ICD leads. Negative lung apices. Upper thoracic levels appear intact. IMPRESSION: 1. No acute traumatic injury identified in the cervical spine. 2. Age-appropriate cervical spine degeneration. Electronically Signed   By: Rexene Edison  Margo Aye M.D.   On: 08/13/2023 04:12   CT HEAD WO CONTRAST ( )  Result Date: 08/13/2023 CLINICAL DATA:  62 year old female status post fall from bed. Struck chin. EXAM: CT HEAD WITHOUT CONTRAST TECHNIQUE: Contiguous axial images were obtained from the base of the skull through the vertex without intravenous contrast. RADIATION DOSE REDUCTION: This exam was performed according to the departmental dose-optimization program which includes automated exposure control, adjustment of the mA and/or kV according to patient size and/or  use of iterative reconstruction technique. COMPARISON:  Head CT 11/07/2021.  Brain MRI 03/05/2022. FINDINGS: Brain: Chronically advanced cerebral volume loss, questionably disproportionate in the bilateral frontal and temporal lobes. Ex vacuo ventricular enlargement has mildly progressed since 2023. Patchy mostly anterior frontal lobe white matter hypodensity bilaterally. Occasional cortical encephalomalacia (series 3, image 14) which appears stable from the previous MRI. No midline shift, mass effect, evidence of mass lesion, intracranial hemorrhage or evidence of cortically based acute infarction. Vascular: No suspicious intracranial vascular hyperdensity. Calcified atherosclerosis at the skull base. Skull: Stable.  No fracture identified. Sinuses/Orbits: Visualized paranasal sinuses and mastoids are stable and well aerated. Other: No discrete orbit or scalp soft tissue injury. IMPRESSION: 1. No acute intracranial abnormality or acute traumatic injury identified. 2. Cerebral Atrophy (ICD10-G31.9). Ex vacuo ventriculomegaly. And patchy superimposed frontal lobe encephalomalacia/gliosis. Electronically Signed   By: Odessa Fleming M.D.   On: 08/13/2023 04:09    PROCEDURES and INTERVENTIONS:  .Marland KitchenLaceration Repair  Date/Time: 08/13/2023 5:22 AM  Performed by: Delton Prairie, MD Authorized by: Delton Prairie, MD   Consent:    Consent obtained:  Emergent situation   Consent given by:  Patient   Risks, benefits, and alternatives were discussed: yes   Anesthesia:    Anesthesia method:  Local infiltration   Local anesthetic:  Lidocaine 1% w/o epi Laceration details:    Location:  Face   Face location:  Chin   Length (cm):  8 Exploration:    Hemostasis achieved with:  Direct pressure   Imaging outcome: foreign body not noted     Contaminated: no   Treatment:    Area cleansed with:  Povidone-iodine   Amount of cleaning:  Standard   Irrigation solution:  Sterile saline Skin repair:    Repair method:   Sutures   Suture size:  4-0   Wound skin closure material used: monocryl.   Suture technique:  Running locked and horizontal mattress   Number of sutures: x1 horizontal mattress, x13 running locked. Approximation:    Approximation:  Close Repair type:    Repair type:  Intermediate Post-procedure details:    Dressing:  Antibiotic ointment   Procedure completion:  Tolerated well, no immediate complications   Medications  bacitracin ointment (has no administration in time range)  Tdap (BOOSTRIX) injection 0.5 mL (has no administration in time range)  QUEtiapine (SEROQUEL) tablet 50 mg (50 mg Oral Given 08/13/23 0343)  oxyCODONE (Oxy IR/ROXICODONE) immediate release tablet 5 mg (5 mg Oral Given 08/13/23 0343)  acetaminophen (TYLENOL) tablet 1,000 mg (1,000 mg Oral Given 08/13/23 0343)  lidocaine (PF) (XYLOCAINE) 1 % injection 10 mL (10 mLs Infiltration Given by Other 08/13/23 0342)     IMPRESSION / MDM / ASSESSMENT AND PLAN / ED COURSE  I reviewed the triage vital signs and the nursing notes.  Differential diagnosis includes, but is not limited to, ICH, mandible fracture, laceration, stroke procedure  {Patient presents with symptoms of an acute illness or injury that is potentially life-threatening.  Pleasantly demented patient presents  after a fall with a chin laceration requiring bedside repair.  No other signs of trauma.  Imaging is reassuring.  Thoroughly cleaned, repaired with absorbable sutures and we will update Tdap and provide bacitracin.  Suitable for return to facility.  Clinical Course as of 08/13/23 0523  Thu Aug 13, 2023  0509 Lac repair complete. X1 horizontal mattress then x13 running locked. 4-0 Monocryl [DS]    Clinical Course User Index [DS] Delton Prairie, MD     FINAL CLINICAL IMPRESSION(S) / ED DIAGNOSES   Final diagnoses:  Facial laceration, initial encounter     Rx / DC Orders   ED Discharge Orders     None        Note:  This document was  prepared using Dragon voice recognition software and may include unintentional dictation errors.   Delton Prairie, MD 08/13/23 [phone]

## 2023-08-13 NOTE — Discharge Instructions (Signed)
14 sutures that will absorb on their own.   Gently wash the wound with soap and water.  It is okay to shower, but do not submerge in a bath or go swimming as it is healing.  Do not vigorously scrub.   Gently pat dry.   Once dry, then apply Neosporin or bacitracin or even Vaseline ointment to the area to act as a barrier to help prevent infection.

## 2023-08-13 NOTE — ED Notes (Signed)
Sent to CT

## 2023-08-13 NOTE — ED Triage Notes (Signed)
Pt arrived via [facility] from [facility] unit where pt rolled out of her bed, striking her chin on the break of her walker. Pt with approx 6-7cm laceration with bleeding controlled. Pt with baseline orientation as A&O to self only due to dementia. No blood thinners listed on medical chart sent by facility.     **POA called to update on pt arrival and plan of care**

## 2023-08-13 NOTE — ED Notes (Signed)
ED Provider at bedside. 

## 2023-08-13 NOTE — ED Notes (Signed)
Pt with severe dementia without ability to answer these questions approprietly or accurately

## 2023-09-28 ENCOUNTER — Ambulatory Visit (INDEPENDENT_AMBULATORY_CARE_PROVIDER_SITE_OTHER): Payer: Medicare Other | Admitting: Nurse Practitioner

## 2023-09-28 ENCOUNTER — Encounter (INDEPENDENT_AMBULATORY_CARE_PROVIDER_SITE_OTHER): Payer: Self-pay | Admitting: Nurse Practitioner

## 2023-09-28 VITALS — BP 95/78 | HR 85 | Resp 16

## 2023-09-28 DIAGNOSIS — I5022 Chronic systolic (congestive) heart failure: Secondary | ICD-10-CM

## 2023-09-28 DIAGNOSIS — I890 Lymphedema, not elsewhere classified: Secondary | ICD-10-CM | POA: Diagnosis not present

## 2023-09-28 DIAGNOSIS — I10 Essential (primary) hypertension: Secondary | ICD-10-CM

## 2023-09-28 NOTE — Progress Notes (Signed)
Subjective:    Patient ID: Katrina Rose, female    DOB: 08-Jul-1961, 62 y.o.   MRN: [id number] No chief complaint on file.   The patient is a 62 year old female who presents today for evaluation of her lower extremity edema.  The patient has dementia and her sister provides much of the history by phone.  She notes that her swelling has been much improved.  Today she has very minimal swelling.  The nursing facility she is at this a very good job about putting her compression socks on in addition to elevating her feet and walking her multiple times per day.    Review of Systems  Cardiovascular:  Negative for leg swelling.  Neurological:  Positive for weakness.  All other systems reviewed and are negative.      Objective:   Physical Exam Vitals reviewed.  HENT:     Head: Normocephalic.  Cardiovascular:     Rate and Rhythm: Normal rate.  Pulmonary:     Effort: Pulmonary effort is normal.  Musculoskeletal:     Right lower leg: No edema.     Left lower leg: No edema.  Skin:    General: Skin is warm and dry.  Neurological:     Mental Status: She is alert and oriented to person, place, and time. Mental status is at baseline.  Psychiatric:        Mood and Affect: Mood normal.        Behavior: Behavior is cooperative.        Cognition and Memory: Cognition is impaired. Memory is impaired.     BP 95/78 (BP Location: Right Arm)   Pulse 85   Resp 16   Past Medical History:  Diagnosis Date   AICD (automatic cardioverter/defibrillator) present    CHF (congestive heart failure) (HCC)    Dementia (HCC)    GERD (gastroesophageal reflux disease)    Hypertension    IDA (iron deficiency anemia)    Insomnia    LBBB (left bundle branch block)     Social History   Socioeconomic History   Marital status: Single    Spouse name: Not on file   Number of children: Not on file   Years of education: Not on file   Highest education level: Not on file  Occupational History   Not  on file  Tobacco Use   Smoking status: Some Days    Types: Cigarettes   Smokeless tobacco: Never   Tobacco comments:    occasional  Substance and Sexual Activity   Alcohol use: Yes    Comment: rarely   Drug use: No   Sexual activity: Not on file  Other Topics Concern   Not on file  Social History Narrative   Not on file   Social Determinants of Health   Financial Resource Strain: Not on file  Food Insecurity: No Food Insecurity (03/22/2023)   Hunger Vital Sign    Worried About Running Out of Food in the Last Year: Never true    Ran Out of Food in the Last Year: Never true  Transportation Needs: No Transportation Needs (03/22/2023)   PRAPARE - Administrator, Civil Service (Medical): No    Lack of Transportation (Non-Medical): No  Physical Activity: Not on file  Stress: Not on file  Social Connections: Not on file  Intimate Partner Violence: Not At Risk (03/22/2023)   Humiliation, Afraid, Rape, and Kick questionnaire    Fear of Current or Ex-Partner: No  Emotionally Abused: No    Physically Abused: No    Sexually Abused: No    Past Surgical History:  Procedure Laterality Date   ANKLE SURGERY Left    CARDIAC CATHETERIZATION     COLONOSCOPY     COLONOSCOPY WITH PROPOFOL N/A 10/07/2021   Procedure: COLONOSCOPY WITH PROPOFOL;  Surgeon: Jaynie Collins, DO;  Location: [facility] ENDOSCOPY;  Service: Gastroenterology;  Laterality: N/A;   debridement and closure sternal wound     DILATION AND CURETTAGE OF UTERUS     ESOPHAGOGASTRODUODENOSCOPY     ESOPHAGOGASTRODUODENOSCOPY (EGD) WITH PROPOFOL N/A 10/07/2021   Procedure: ESOPHAGOGASTRODUODENOSCOPY (EGD) WITH PROPOFOL;  Surgeon: Jaynie Collins, DO;  Location: [facility] ENDOSCOPY;  Service: Gastroenterology;  Laterality: N/A;   ESOPHAGOGASTRODUODENOSCOPY (EGD) WITH PROPOFOL N/A 03/22/2023   Procedure: ESOPHAGOGASTRODUODENOSCOPY (EGD) WITH PROPOFOL;  Surgeon: [name], MD;  Location: [facility] ENDOSCOPY;  Service:  Gastroenterology;  Laterality: N/A;   ICD GENERATOR CHANGEOUT N/A 08/01/2021   Procedure: ICD GENERATOR CHANGEOUT;  Surgeon: Marcina Millard, MD;  Location: [facility] INVASIVE CV LAB;  Service: Cardiovascular;  Laterality: N/A;   pacermaker/ defibrilater   2010   TUBAL LIGATION      Family History  Problem Relation Age of Onset   CAD Mother    Colon cancer Father     Allergies  Allergen Reactions   Sulfa Antibiotics Hives       Latest Ref Rng & Units 03/25/2023    4:16 AM 03/23/2023    4:56 AM 03/22/2023    4:42 PM  CBC  WBC 4.0 - 10.5 K/uL 5.1     Hemoglobin 12.0 - 15.0 g/dL 56.3  87.5  64.3   Hematocrit 36.0 - 46.0 % 35.1     Platelets 150 - 400 K/uL 216         CMP     Component Value Date/Time   NA 135 03/25/2023 0416   NA 139 09/21/2014 1017   K 3.8 03/25/2023 0416   K 3.9 09/21/2014 1017   CL 105 03/25/2023 0416   CL 105 09/21/2014 1017   CO2 23 03/25/2023 0416   CO2 29 09/21/2014 1017   GLUCOSE 116 (H) 03/25/2023 0416   GLUCOSE 81 09/21/2014 1017   BUN 18 03/25/2023 0416   BUN 19 (H) 09/21/2014 1017   CREATININE 1.02 (H) 03/25/2023 0416   CREATININE 0.89 09/21/2014 1017   CALCIUM 8.5 (L) 03/25/2023 0416   CALCIUM 9.5 09/21/2014 1017   PROT 6.7 03/21/2023 1442   PROT 6.6 06/01/2012 0926   ALBUMIN 3.7 03/21/2023 1442   ALBUMIN 3.1 (L) 06/01/2012 0926   AST 14 (L) 03/21/2023 1442   AST 26 06/01/2012 0926   ALT 9 03/21/2023 1442   ALT 15 06/01/2012 0926   ALKPHOS 82 03/21/2023 1442   ALKPHOS 77 06/01/2012 0926   BILITOT 0.5 03/21/2023 1442   BILITOT 0.2 06/01/2012 0926   GFRNONAA >60 03/25/2023 0416   GFRNONAA >60 09/21/2014 1017   GFRNONAA >60 06/01/2012 0926     No results found.     Assessment & Plan:   1. Lymphedema Today the patient has been doing very well with conservative therapy.  Her legs are much better under control in terms of swelling from when she was seen previously.  Her facility has been doing a very good job at adhering to  conservative therapy including compression elevation and activity.  Will have the patient return in 1 year unless issues arise.  At this time I do not feel she  needs a lymphedema pump due to her good control.  2. Hypertension, unspecified type Continue antihypertensive medications as already ordered, these medications have been reviewed and there are no changes at this time.  3. Chronic systolic CHF (congestive heart failure), NYHA class 3 (HCC) Appears euvolemic but this could also worsen her edema   Current Outpatient Medications on File Prior to Visit  Medication Sig Dispense Refill   acetaminophen (PHARBETOL) 325 MG tablet Take 650 mg by mouth every 6 (six) hours as needed for headache.     ALERTAB 25 MG tablet Take 25 mg by mouth every 6 (six) hours as needed.     alum & mag hydroxide-simeth (GERI-LANTA) 200-200-20 MG/5ML suspension Take 30 mLs by mouth every 6 (six) hours as needed for indigestion or heartburn.     atorvastatin (LIPITOR) 20 MG tablet Take 20 mg by mouth daily.     benzonatate (TESSALON) 200 MG capsule Take 200 mg by mouth 3 (three) times daily as needed for cough.     buPROPion ER (WELLBUTRIN SR) 100 MG 12 hr tablet Take 100 mg by mouth 2 (two) times daily.     Cholecalciferol (VITAMIN D3) 25 MCG (1000 UT) CAPS Take by mouth daily.     ferrous sulfate (FEROSUL) 325 (65 FE) MG tablet Take 325 mg by mouth daily with breakfast.     furosemide (LASIX) 20 MG tablet Take 20 mg by mouth daily.     guaiFENesin (GERI-TUSSIN) 100 MG/5ML liquid Take 15 mLs by mouth every 4 (four) hours as needed for cough or to loosen phlegm.     hydrocortisone cream 1 % Apply 1 Application topically 2 (two) times daily. Apply Daily To Minor Skin/ Irritations Until Healed (Notify Physician If Symptoms Persist Greater Than 3 Days)     Lactobacillus 0.05-0.05 MG TABS      loperamide (ANTI-DIARRHEAL) 2 MG tablet Take 2 mg by mouth 4 (four) times daily as needed for diarrhea or loose stools.      magnesium hydroxide (MILK OF MAGNESIA) 400 MG/5ML suspension Take 30 mLs by mouth daily as needed for mild constipation.     Melatonin 5 MG CHEW      Pancrelipase, Lip-Prot-Amyl, (CREON) 24000-76000 units CPEP Take 24,000 Units by mouth 3 (three) times daily before meals.     pantoprazole (PROTONIX) 40 MG tablet Take 40 mg by mouth daily.     potassium chloride (MICRO-K) 10 MEQ CR capsule Take 10 mEq by mouth daily.     QUEtiapine (SEROQUEL) 100 MG tablet Take 1 tablet (100 mg total) by mouth at bedtime.     rivastigmine (EXELON) 3 MG capsule Take 3 mg by mouth daily.     sodium bicarbonate 650 MG tablet Take 650 mg by mouth 2 (two) times daily.     No current facility-administered medications on file prior to visit.    There are no Patient Instructions on file for this visit. No follow-ups on file.   Katrina Spinner, NP

## 2023-09-29 ENCOUNTER — Encounter (INDEPENDENT_AMBULATORY_CARE_PROVIDER_SITE_OTHER): Payer: Self-pay | Admitting: Nurse Practitioner

## 2023-11-20 ENCOUNTER — Emergency Department
Admission: EM | Admit: 2023-11-20 | Discharge: 2023-11-20 | Disposition: A | Discharge status: Home / Self Care | Payer: Medicare Other | Attending: Emergency Medicine | Admitting: Emergency Medicine

## 2023-11-20 ENCOUNTER — Emergency Department: Payer: Medicare Other

## 2023-11-20 ENCOUNTER — Other Ambulatory Visit: Payer: Self-pay

## 2023-11-20 ENCOUNTER — Encounter: Payer: Self-pay | Admitting: Emergency Medicine

## 2023-11-20 DIAGNOSIS — W07XXXA Fall from chair, initial encounter: Secondary | ICD-10-CM | POA: Insufficient documentation

## 2023-11-20 DIAGNOSIS — F0390 Unspecified dementia without behavioral disturbance: Secondary | ICD-10-CM | POA: Diagnosis not present

## 2023-11-20 DIAGNOSIS — R040 Epistaxis: Secondary | ICD-10-CM | POA: Insufficient documentation

## 2023-11-20 DIAGNOSIS — S0992XA Unspecified injury of nose, initial encounter: Secondary | ICD-10-CM | POA: Diagnosis present

## 2023-11-20 DIAGNOSIS — S0990XA Unspecified injury of head, initial encounter: Secondary | ICD-10-CM | POA: Diagnosis not present

## 2023-11-20 DIAGNOSIS — S0121XA Laceration without foreign body of nose, initial encounter: Secondary | ICD-10-CM | POA: Insufficient documentation

## 2023-11-20 DIAGNOSIS — W19XXXA Unspecified fall, initial encounter: Secondary | ICD-10-CM

## 2023-11-20 LAB — BASIC METABOLIC PANEL
Anion gap: 11 (ref 5–15)
BUN: 13 mg/dL (ref 8–23)
CO2: 27 mmol/L (ref 22–32)
Calcium: 9 mg/dL (ref 8.9–10.3)
Chloride: 101 mmol/L (ref 98–111)
Creatinine, Ser: 0.96 mg/dL (ref 0.44–1.00)
GFR, Estimated: >60 mL/min (ref >60)
Glucose, Bld: 100 mg/dL — ABNORMAL HIGH (ref 70–99)
Potassium: 3.1 mmol/L — ABNORMAL LOW (ref 3.5–5.1)
Sodium: 139 mmol/L (ref 135–145)

## 2023-11-20 LAB — CBC WITH DIFFERENTIAL/PLATELET
Abs Immature Granulocytes: 0.02 10*3/uL (ref 0.00–0.07)
Basophils Absolute: 0 10*3/uL (ref 0.0–0.1)
Basophils Relative: 1 %
Eosinophils Absolute: 0.2 10*3/uL (ref 0.0–0.5)
Eosinophils Relative: 4 %
HCT: 35.6 % — ABNORMAL LOW (ref 36.0–46.0)
Hemoglobin: 11.3 g/dL — ABNORMAL LOW (ref 12.0–15.0)
Immature Granulocytes: 0 %
Lymphocytes Relative: 22 %
Lymphs Abs: 1.4 10*3/uL (ref 0.7–4.0)
MCH: 27 pg (ref 26.0–34.0)
MCHC: 31.7 g/dL (ref 30.0–36.0)
MCV: 85.2 fL (ref 80.0–100.0)
Monocytes Absolute: 0.5 10*3/uL (ref 0.1–1.0)
Monocytes Relative: 7 %
Neutro Abs: 4.4 10*3/uL (ref 1.7–7.7)
Neutrophils Relative %: 66 %
Platelets: 215 10*3/uL (ref 150–400)
RBC: 4.18 MIL/uL (ref 3.87–5.11)
RDW: 15.2 % (ref 11.5–15.5)
WBC: 6.6 10*3/uL (ref 4.0–10.5)
nRBC: 0 % (ref 0.0–0.2)

## 2023-11-20 MED ORDER — POTASSIUM CHLORIDE 20 MEQ PO PACK
40.0000 meq | PACK | Freq: Two times a day (BID) | ORAL | Status: DC
  Administered 2023-11-20: 40 meq via ORAL
  Filled 2023-11-20: qty 2

## 2023-11-20 NOTE — ED Provider Notes (Signed)
[facility] Provider Note    Event Date/Time   First MD Initiated Contact with Patient 11/20/23 1504     (approximate)   History   Fall   HPI  Katrina Rose is a 63 y.o. female  who presents to the emergency department today from living facility after a fall. Report is that patient fell while she was sitting in a chair and fell forward onto her face. Patient has history of dementia and cannot give any history.  Sister states that patient has been acting at her baseline while they have been here in the emergency department.  There is also report of possible seizure-like activity although daughter states patient has never had a seizure.      Physical Exam   Triage Vital Signs: ED Triage Vitals [11/20/23 0655]  Encounter Vitals Group     BP 117/76     Systolic BP Percentile      Diastolic BP Percentile      Pulse Rate 86     Resp 16     Temp 97.7 F (36.5 C)     Temp Source Oral     SpO2 100 %     Weight 122 lb 5.7 oz (55.5 kg)     Height 5\' 1"  (1.549 m)     Head Circumference      Peak Flow      Pain Score 0     Pain Loc      Pain Education      Exclude from Growth Chart     Most recent vital signs: Vitals:   11/20/23 1430 11/20/23 1445  BP: (!) 114/59   Pulse: 93 86  Resp: 20   Temp:    SpO2: 96% 96%   General: Awake, alert, not oriented. CV:  Good peripheral perfusion.  Resp:  Normal effort.  Abd:  No distention.  Other:  Dried blood around bilateral nares. No septal hematoma. No evidence of active bleeding. Some bruising and small laceration to bridge of nose.    ED Results / Procedures / Treatments   Labs (all labs ordered are listed, but only abnormal results are displayed) Labs Reviewed  BASIC METABOLIC PANEL - Abnormal; Notable for the following components:      Result Value   Potassium 3.1 (*)    Glucose, Bld 100 (*)    All other components within normal limits  CBC WITH DIFFERENTIAL/PLATELET - Abnormal; Notable  for the following components:   Hemoglobin 11.3 (*)    HCT 35.6 (*)    All other components within normal limits     EKG  None   RADIOLOGY I independently interpreted and visualized the CT head/cervical spine. My interpretation: No ICH, no fracture Radiology interpretation:  IMPRESSION:  1. No evidence of significant acute traumatic injury to the skull,  brain or cervical spine.  2. Severe cerebral atrophy with chronic microvascular ischemic  changes in the cerebral white matter in addition to more pronounced  bilateral frontal areas of encephalomalacia/gliosis, similar to the  prior examination, as above.  3. Multilevel degenerative disc disease and cervical spondylosis, as  above.      PROCEDURES:  Critical Care performed: No    MEDICATIONS ORDERED IN ED: Medications - No data to display   IMPRESSION / MDM / ASSESSMENT AND PLAN / ED COURSE  I reviewed the triage vital signs and the nursing notes.  Differential diagnosis includes, but is not limited to, fracture, ICH, electrolyte abnormality  Patient's presentation is most consistent with acute presentation with potential threat to life or bodily function.   Patient presents from living facility today after a fall.  Patient does have some dried blood around her naris.  No active bleeding or septal hematoma.  CT head and cervical spine without acute fracture or intracranial hemorrhage.  I did discuss possibility of nasal bone fracture with sister at bedside.  Sister does state that patient appears to be acting at her baseline.  At this time I think be reasonable for patient be discharged back to living facility.     FINAL CLINICAL IMPRESSION(S) / ED DIAGNOSES   Final diagnoses:  Fall, initial encounter  Epistaxis     Note:  This document was prepared using Dragon voice recognition software and may include unintentional dictation errors.    Phineas Semen, MD 11/20/23  [phone]

## 2023-11-20 NOTE — ED Notes (Signed)
RN at bedside attempting to clean pt's face/nose. Pt pushing RN's hands away. Will reattempt once ready for dc

## 2023-11-20 NOTE — ED Triage Notes (Signed)
EMS brings pt in from [facility]; staff reports pt "threw herself in the chair and face planted on the floor"; denies LOC

## 2023-11-20 NOTE — ED Notes (Signed)
DNR form left in ED room 16. RN gave DRN form to daughter, Encarnacion Slates.

## 2023-11-20 NOTE — ED Notes (Addendum)
RN attempting to call [facility] for discharge report without success. Family transporting pt to facility

## 2023-11-20 NOTE — ED Triage Notes (Signed)
See First Nurse note. During triage patient only sporadically answering questions, denies pain, does not remember event. Sister with patient states this is abnormal that she got a call from facility that she fell in the floor face first and then began to have seizure like activity. Patient does not have hx of seizures. Patient is alert, unable to tell orientation currently, pupils equal and reactive.

## 2023-11-20 NOTE — Discharge Instructions (Signed)
Please seek medical attention for any high fevers, chest pain, shortness of breath, change in behavior, persistent vomiting, bloody stool or any other new or concerning symptoms.  

## 2024-01-31 ENCOUNTER — Emergency Department

## 2024-01-31 ENCOUNTER — Encounter: Payer: Self-pay | Admitting: Emergency Medicine

## 2024-01-31 ENCOUNTER — Other Ambulatory Visit: Payer: Self-pay

## 2024-01-31 ENCOUNTER — Inpatient Hospital Stay
Admission: EM | Admit: 2024-01-31 | Discharge: 2024-02-02 | DRG: 101 | Disposition: A | Discharge status: Skilled Nursing Facility | Attending: Internal Medicine | Admitting: Internal Medicine

## 2024-01-31 DIAGNOSIS — Z79899 Other long term (current) drug therapy: Secondary | ICD-10-CM

## 2024-01-31 DIAGNOSIS — R569 Unspecified convulsions: Principal | ICD-10-CM

## 2024-01-31 DIAGNOSIS — G9340 Encephalopathy, unspecified: Secondary | ICD-10-CM | POA: Diagnosis not present

## 2024-01-31 DIAGNOSIS — F17210 Nicotine dependence, cigarettes, uncomplicated: Secondary | ICD-10-CM | POA: Diagnosis present

## 2024-01-31 DIAGNOSIS — F03918 Unspecified dementia, unspecified severity, with other behavioral disturbance: Secondary | ICD-10-CM | POA: Diagnosis present

## 2024-01-31 DIAGNOSIS — G40409 Other generalized epilepsy and epileptic syndromes, not intractable, without status epilepticus: Secondary | ICD-10-CM | POA: Diagnosis not present

## 2024-01-31 DIAGNOSIS — I10 Essential (primary) hypertension: Secondary | ICD-10-CM | POA: Diagnosis present

## 2024-01-31 DIAGNOSIS — Z66 Do not resuscitate: Secondary | ICD-10-CM | POA: Diagnosis present

## 2024-01-31 DIAGNOSIS — Z95810 Presence of automatic (implantable) cardiac defibrillator: Secondary | ICD-10-CM | POA: Diagnosis present

## 2024-01-31 DIAGNOSIS — R4182 Altered mental status, unspecified: Principal | ICD-10-CM

## 2024-01-31 DIAGNOSIS — Z8249 Family history of ischemic heart disease and other diseases of the circulatory system: Secondary | ICD-10-CM

## 2024-01-31 DIAGNOSIS — K219 Gastro-esophageal reflux disease without esophagitis: Secondary | ICD-10-CM | POA: Diagnosis present

## 2024-01-31 DIAGNOSIS — I509 Heart failure, unspecified: Secondary | ICD-10-CM

## 2024-01-31 DIAGNOSIS — G4700 Insomnia, unspecified: Secondary | ICD-10-CM | POA: Diagnosis present

## 2024-01-31 DIAGNOSIS — I110 Hypertensive heart disease with heart failure: Secondary | ICD-10-CM | POA: Diagnosis present

## 2024-01-31 DIAGNOSIS — E785 Hyperlipidemia, unspecified: Secondary | ICD-10-CM | POA: Diagnosis present

## 2024-01-31 DIAGNOSIS — Z882 Allergy status to sulfonamides status: Secondary | ICD-10-CM

## 2024-01-31 DIAGNOSIS — I5022 Chronic systolic (congestive) heart failure: Secondary | ICD-10-CM | POA: Diagnosis present

## 2024-01-31 LAB — CBC WITH DIFFERENTIAL/PLATELET
Abs Immature Granulocytes: 0.05 10*3/uL (ref 0.00–0.07)
Basophils Absolute: 0 10*3/uL (ref 0.0–0.1)
Basophils Relative: 0 %
Eosinophils Absolute: 0.1 10*3/uL (ref 0.0–0.5)
Eosinophils Relative: 3 %
HCT: 36.8 % (ref 36.0–46.0)
Hemoglobin: 11.8 g/dL — ABNORMAL LOW (ref 12.0–15.0)
Immature Granulocytes: 1 %
Lymphocytes Relative: 30 %
Lymphs Abs: 1.2 10*3/uL (ref 0.7–4.0)
MCH: 28.2 pg (ref 26.0–34.0)
MCHC: 32.1 g/dL (ref 30.0–36.0)
MCV: 87.8 fL (ref 80.0–100.0)
Monocytes Absolute: 0.3 10*3/uL (ref 0.1–1.0)
Monocytes Relative: 8 %
Neutro Abs: 2.3 10*3/uL (ref 1.7–7.7)
Neutrophils Relative %: 58 %
Platelets: 171 10*3/uL (ref 150–400)
RBC: 4.19 MIL/uL (ref 3.87–5.11)
RDW: 15.8 % — ABNORMAL HIGH (ref 11.5–15.5)
WBC: 4 10*3/uL (ref 4.0–10.5)
nRBC: 0 % (ref 0.0–0.2)

## 2024-01-31 LAB — COMPREHENSIVE METABOLIC PANEL WITH GFR
ALT: 16 U/L (ref 0–44)
AST: 21 U/L (ref 15–41)
Albumin: 3.7 g/dL (ref 3.5–5.0)
Alkaline Phosphatase: 91 U/L (ref 38–126)
Anion gap: 10 (ref 5–15)
BUN: 13 mg/dL (ref 8–23)
CO2: 26 mmol/L (ref 22–32)
Calcium: 9.2 mg/dL (ref 8.9–10.3)
Chloride: 100 mmol/L (ref 98–111)
Creatinine, Ser: 0.98 mg/dL (ref 0.44–1.00)
GFR, Estimated: >60 mL/min (ref >60)
Glucose, Bld: 100 mg/dL — ABNORMAL HIGH (ref 70–99)
Potassium: 3.7 mmol/L (ref 3.5–5.1)
Sodium: 136 mmol/L (ref 135–145)
Total Bilirubin: 0.3 mg/dL (ref 0.0–1.2)
Total Protein: 7.3 g/dL (ref 6.5–8.1)

## 2024-01-31 LAB — TROPONIN I (HIGH SENSITIVITY)
Troponin I (High Sensitivity): 4 ng/L (ref <18)
Troponin I (High Sensitivity): 5 ng/L (ref <18)

## 2024-01-31 LAB — CBG MONITORING, ED: Glucose-Capillary: 95 mg/dL (ref 70–99)

## 2024-01-31 MED ORDER — LORAZEPAM 2 MG/ML IJ SOLN
2.0000 mg | Freq: Once | INTRAMUSCULAR | Status: AC
Start: 2024-01-31 — End: 2024-01-31
  Administered 2024-01-31: 2 mg via INTRAVENOUS

## 2024-01-31 MED ORDER — ENOXAPARIN SODIUM 40 MG/0.4ML IJ SOSY
40.0000 mg | PREFILLED_SYRINGE | INTRAMUSCULAR | Status: DC
  Administered 2024-01-31 – 2024-02-01 (×2): 40 mg via SUBCUTANEOUS
  Filled 2024-01-31 (×2): qty 0.4

## 2024-01-31 MED ORDER — SODIUM CHLORIDE 0.9 % IV SOLN
75.0000 mL/h | INTRAVENOUS | Status: AC
Start: 2024-01-31 — End: 2024-02-01
  Administered 2024-01-31 – 2024-02-01 (×3): 75 mL/h via INTRAVENOUS

## 2024-01-31 MED ORDER — ONDANSETRON HCL 4 MG/2ML IJ SOLN: 4.0000 mg | Freq: Four times a day (QID) | INTRAMUSCULAR | Status: DC | PRN

## 2024-01-31 MED ORDER — SODIUM CHLORIDE 0.9 % IV BOLUS
250.0000 mL | Freq: Once | INTRAVENOUS | Status: AC
  Administered 2024-01-31: 250 mL via INTRAVENOUS

## 2024-01-31 MED ORDER — LEVETIRACETAM 500 MG PO TABS
500.0000 mg | ORAL_TABLET | Freq: Two times a day (BID) | ORAL | Status: DC
  Administered 2024-02-01 – 2024-02-02 (×3): 500 mg via ORAL
  Filled 2024-01-31 (×3): qty 1

## 2024-01-31 MED ORDER — ORAL CARE MOUTH RINSE: 15.0000 mL | OROMUCOSAL | Status: DC | PRN

## 2024-01-31 MED ORDER — LORAZEPAM 2 MG/ML IJ SOLN
INTRAMUSCULAR | Status: AC
  Filled 2024-01-31: qty 1

## 2024-01-31 MED ORDER — ONDANSETRON HCL 4 MG PO TABS: 4.0000 mg | ORAL_TABLET | Freq: Four times a day (QID) | ORAL | Status: DC | PRN

## 2024-01-31 MED ORDER — LEVETIRACETAM IN NACL 1000 MG/100ML IV SOLN
INTRAVENOUS | Status: AC
  Administered 2024-01-31: 1000 mg via INTRAVENOUS
  Filled 2024-01-31: qty 100

## 2024-01-31 MED ORDER — PANTOPRAZOLE SODIUM 40 MG PO TBEC
40.0000 mg | DELAYED_RELEASE_TABLET | Freq: Every day | ORAL | Status: DC
  Administered 2024-02-01 – 2024-02-02 (×2): 40 mg via ORAL
  Filled 2024-01-31 (×2): qty 1

## 2024-01-31 MED ORDER — LEVETIRACETAM IN NACL 1000 MG/100ML IV SOLN
1000.0000 mg | Freq: Once | INTRAVENOUS | Status: AC
Start: 2024-01-31 — End: 2024-01-31

## 2024-01-31 MED ORDER — ORAL CARE MOUTH RINSE
15.0000 mL | OROMUCOSAL | Status: DC
  Filled 2024-01-31 (×8): qty 15

## 2024-01-31 MED ORDER — LORAZEPAM 2 MG/ML IJ SOLN: 4.0000 mg | INTRAMUSCULAR | Status: DC | PRN

## 2024-01-31 NOTE — Plan of Care (Addendum)
       CROSS COVER NOTE  NAME: Katrina Rose MRN: [id number] DOB : 1961/05/07    Concern as stated by nurse / staff   I attempted a bed side swallow. She did not cough, but she would only take a small sip at a time and maybe drank an ounce of the liquid I offered her before refusing to drink any more.      Pertinent findings on chart review: .  H&P reviewed.  Patient came in with a breakthrough seizure.  Was encephalopathic on admission  Assessment and  Interventions   Assessment:  Possible dysphagia related to encephalopathy  Plan: Will keep n.p.o. overnight to prevent aspiration, ice chips Received 500 cc bolus in the ED Consider speech eval in the a.m.

## 2024-01-31 NOTE — H&P (Signed)
 History and Physical    Patient: Katrina Rose:[id number] DOB: Mar 31, 1961 DOA: 01/31/2024 DOS: the patient was seen and examined on 01/31/2024 PCP: Smiley Houseman, NP  Patient coming from: SNF  Chief Complaint:  Chief Complaint  Patient presents with   Altered Mental Status   HPI: Katrina Rose is a 63 y.o. female with medical history significant of end-stage dementia, GERD, hypertension, left bundle branch block status post AICD, heart failure presenting with seizure.  Limited history in the setting of encephalopathy.  Per report, patient was found lethargic at skilled nursing facility.  Lethargy persisted over the course of the morning and patient was subsequently sent to the ER for further evaluation.  Patient noted to be DNR at baseline.  After fairly benign workup in the ER, patient had a witnessed generalized tonic-clonic seizure lasting roughly 1 to 1-1/2 minutes.  Was given Ativan as well as IV Keppra for management. Presented to the ER afebrile, hemodynamically stable.  Satting well on room air.  White count 4, hemoglobin 12, platelets 171, creatinine 0.98.  CT head within normal limits.  Chest x-ray stable. Review of Systems: As mentioned in the history of present illness. All other systems reviewed and are negative. Past Medical History:  Diagnosis Date   AICD (automatic cardioverter/defibrillator) present    CHF (congestive heart failure) (HCC)    Dementia (HCC)    GERD (gastroesophageal reflux disease)    Hypertension    IDA (iron deficiency anemia)    Insomnia    LBBB (left bundle branch block)    Past Surgical History:  Procedure Laterality Date   ANKLE SURGERY Left    CARDIAC CATHETERIZATION     COLONOSCOPY     COLONOSCOPY WITH PROPOFOL N/A 10/07/2021   Procedure: COLONOSCOPY WITH PROPOFOL;  Surgeon: Jaynie Collins, DO;  Location: [facility] ENDOSCOPY;  Service: Gastroenterology;  Laterality: N/A;   debridement and closure sternal wound     DILATION AND  CURETTAGE OF UTERUS     ESOPHAGOGASTRODUODENOSCOPY     ESOPHAGOGASTRODUODENOSCOPY (EGD) WITH PROPOFOL N/A 10/07/2021   Procedure: ESOPHAGOGASTRODUODENOSCOPY (EGD) WITH PROPOFOL;  Surgeon: Jaynie Collins, DO;  Location: [facility] ENDOSCOPY;  Service: Gastroenterology;  Laterality: N/A;   ESOPHAGOGASTRODUODENOSCOPY (EGD) WITH PROPOFOL N/A 03/22/2023   Procedure: ESOPHAGOGASTRODUODENOSCOPY (EGD) WITH PROPOFOL;  Surgeon: [name], MD;  Location: [facility] ENDOSCOPY;  Service: Gastroenterology;  Laterality: N/A;   ICD GENERATOR CHANGEOUT N/A 08/01/2021   Procedure: ICD GENERATOR CHANGEOUT;  Surgeon: Marcina Millard, MD;  Location: [facility] INVASIVE CV LAB;  Service: Cardiovascular;  Laterality: N/A;   pacermaker/ defibrilater   2010   TUBAL LIGATION     Social History:  reports that she has been smoking cigarettes. She has never used smokeless tobacco. She reports current alcohol use. She reports that she does not use drugs.  Allergies  Allergen Reactions   Sulfa Antibiotics Hives    Family History  Problem Relation Age of Onset   CAD Mother    Colon cancer Father     Prior to Admission medications   Medication Sig Start Date End Date Taking? Authorizing Provider  acetaminophen (PHARBETOL) 325 MG tablet Take 650 mg by mouth every 6 (six) hours as needed for headache.    [provider]  ALERTAB 25 MG tablet Take 25 mg by mouth every 6 (six) hours as needed.    [provider]  alum & mag hydroxide-simeth (GERI-LANTA) 200-200-20 MG/5ML suspension Take 30 mLs by mouth every 6 (six) hours as needed for indigestion or  heartburn.    [provider]  atorvastatin (LIPITOR) 20 MG tablet Take 20 mg by mouth daily.    [provider]  benzonatate (TESSALON) 200 MG capsule Take 200 mg by mouth 3 (three) times daily as needed for cough.    [provider]  buPROPion ER (WELLBUTRIN SR) 100 MG 12 hr tablet Take 100 mg by mouth 2 (two) times daily.    [provider]  Cholecalciferol (VITAMIN D3) 25 MCG (1000 UT) CAPS Take by mouth daily.    [provider]  ferrous sulfate (FEROSUL) 325 (65 FE) MG tablet Take 325 mg by mouth daily with breakfast.    [provider]  furosemide (LASIX) 20 MG tablet Take 20 mg by mouth daily.    [provider]  guaiFENesin (GERI-TUSSIN) 100 MG/5ML liquid Take 15 mLs by mouth every 4 (four) hours as needed for cough or to loosen phlegm.    [provider]  hydrocortisone cream 1 % Apply 1 Application topically 2 (two) times daily. Apply Daily To Minor Skin/ Irritations Until Healed (Notify Physician If Symptoms Persist Greater Than 3 Days)    [provider]  Lactobacillus 0.05-0.05 MG TABS     [provider]  loperamide (ANTI-DIARRHEAL) 2 MG tablet Take 2 mg by mouth 4 (four) times daily as needed for diarrhea or loose stools.    [provider]  magnesium hydroxide (MILK OF MAGNESIA) 400 MG/5ML suspension Take 30 mLs by mouth daily as needed for mild constipation.    [provider]  Melatonin 5 MG CHEW     [provider]  Pancrelipase, Lip-Prot-Amyl, (CREON) 24000-76000 units CPEP Take 24,000 Units by mouth 3 (three) times daily before meals.    [provider]  pantoprazole (PROTONIX) 40 MG tablet Take 40 mg by mouth daily.    [provider]  potassium chloride (MICRO-K) 10 MEQ CR capsule Take 10 mEq by mouth daily. 06/17/23   [provider]  QUEtiapine (SEROQUEL) 100 MG tablet Take 1 tablet (100 mg total) by mouth at bedtime. 03/27/23   [name], MD  rivastigmine (EXELON) 3 MG capsule Take 3 mg by mouth daily.    [provider]  sodium bicarbonate 650 MG tablet Take 650 mg by mouth 2 (two) times daily.    [provider]    Physical Exam: Vitals:   01/31/24 1130 01/31/24 1200 01/31/24 1235 01/31/24 1241  BP: 133/73 96/61 120/84   Pulse: (!) 124 94 97   Resp: (!) 24 14 15     Temp:      TempSrc:      SpO2: 100%  99%   Weight:    56.2 kg   Physical Exam Constitutional:      Comments: + lethargy at the bedside s/p IV ativan    HENT:     Head: Normocephalic and atraumatic.     Nose: Nose normal.  Eyes:     Pupils: Pupils are equal, round, and reactive to light.  Cardiovascular:     Rate and Rhythm: Normal rate and regular rhythm.  Pulmonary:     Effort: Pulmonary effort is normal.  Abdominal:     General: Bowel sounds are normal.  Musculoskeletal:     Comments: + generalized weakness    Skin:    General: Skin is warm.  Neurological:     Comments: + generalized lethargy Grossly nonfocal neuro exam    Psychiatric:  Mood and Affect: Mood normal.     Data Reviewed:  There are no new results to review at this time.  DG Chest Portable 1 View CLINICAL DATA:  Syncope.  EXAM: PORTABLE CHEST 1 VIEW  COMPARISON:  02/20/2017  FINDINGS: There is a left chest wall ICD with leads in the right atrial appendage, coronary sinus and right ventricle. Heart size and mediastinal contours appear normal. No signs of pleural effusion, interstitial edema or airspace consolidation. Visualized osseous structures appear grossly intact.  IMPRESSION: No acute cardiopulmonary abnormalities.  Electronically Signed   By: Signa Kell M.D.   On: 01/31/2024 08:21 CT HEAD WO CONTRAST ( ) CLINICAL DATA:  Seizure, new onset, no history of trauma  EXAM: CT HEAD WITHOUT CONTRAST  TECHNIQUE: Contiguous axial images were obtained from the base of the skull through the vertex without intravenous contrast.  RADIATION DOSE REDUCTION: This exam was performed according to the departmental dose-optimization program which includes automated exposure control, adjustment of the mA and/or kV according to patient size and/or use of iterative reconstruction technique.  COMPARISON:  11/20/2023  FINDINGS: Brain: No evidence of acute infarction, hemorrhage,  hydrocephalus, extra-axial collection or mass lesion/mass effect. Frontal predominant atrophy in this patient with history of dementia. White matter low-density is also greater in the frontal lobes.  Vascular: No hyperdense vessel or unexpected calcification.  Skull: Normal. Negative for fracture or focal lesion.  Sinuses/Orbits: No acute finding.  IMPRESSION: No acute finding.  Bifrontal atrophy.  Electronically Signed   By: Tiburcio Pea M.D.   On: 01/31/2024 08:14  Lab Results  Component Value Date   WBC 4.0 01/31/2024   HGB 11.8 (L) 01/31/2024   HCT 36.8 01/31/2024   MCV 87.8 01/31/2024   PLT 171 01/31/2024   leftLast metabolic panel Lab Results  Component Value Date   GLUCOSE 100 (H) 01/31/2024   NA 136 01/31/2024   K 3.7 01/31/2024   CL 100 01/31/2024   CO2 26 01/31/2024   BUN 13 01/31/2024   CREATININE 0.98 01/31/2024   GFRNONAA >60 01/31/2024   CALCIUM 9.2 01/31/2024   PROT 7.3 01/31/2024   ALBUMIN 3.7 01/31/2024   BILITOT 0.3 01/31/2024   ALKPHOS 91 01/31/2024   AST 21 01/31/2024   ALT 16 01/31/2024   ANIONGAP 10 01/31/2024    Assessment and Plan: * Seizure (HCC) Generalized encephalopathy at nursing home today with noted witnessed generalized tonic-clonic seizure in the ER Status post Ativan as well as IV Keppra CT head within normal limits Will plan for formal evaluation including EEG as well as MRI of the brain pending clearance with AICD (noted prior MRI with AICD in place and May 2023) Neurology consulted Follow-up recommendations  AICD (automatic cardioverter/defibrillator) present Baseline history of left bundle branch block status post AICD Sinus rhythm on EKG Monitor  Chronic systolic CHF (congestive heart failure), NYHA class 3 (HCC) 2D echo May 2024 with a EF of 40 to 45% grade 1 diastolic function Appears euvolemic Monitor  Hypertension BP stable Titrate home regimen  Dementia with behavioral disturbance (HCC) Noted  baseline end-stage dementia Continue Seroquel      Advance Care Planning:   Code Status: Limited: Do not attempt resuscitation (DNR) -DNR-LIMITED -Do Not Intubate/DNI    Consults: Neurology   Family Communication: Case discussed with daughter and sister (POA) over the phone   Severity of Illness: The appropriate patient status for this patient is OBSERVATION. Observation status is judged to be reasonable and necessary in order to provide the  required intensity of service to ensure the patient's safety. The patient's presenting symptoms, physical exam findings, and initial radiographic and laboratory data in the context of their medical condition is felt to place them at decreased risk for further clinical deterioration. Furthermore, it is anticipated that the patient will be medically stable for discharge from the hospital within 2 midnights of admission.   Greater than 50% was spent in counseling and coordination of care with patient Total encounter time 80 minutes or more  Author: Floydene Flock, MD 01/31/2024 12:50 PM  For on call review [email or web address].

## 2024-01-31 NOTE — ED Notes (Signed)
 Called into patient's room by daughter. Pt having tonic-clonic seizure that lasted approximately 1-1.5 minutes. Pt did bite tongue. Bleeding controlled. Dr. Roxan Hockey notified and additional orders were placed.

## 2024-01-31 NOTE — ED Triage Notes (Signed)
 Pt BIB [facility] with from [facility]. Per staff while patient was eating she began to have "jerking motions." At the time of EMS arrival pt was lethargic. Per Ems pt is normally able to speak. Pt does have hx of dementia.

## 2024-01-31 NOTE — Assessment & Plan Note (Addendum)
 BP stable Titrate home regimen

## 2024-01-31 NOTE — Assessment & Plan Note (Signed)
 Generalized encephalopathy at nursing home today with noted witnessed generalized tonic-clonic seizure in the ER Status post Ativan as well as IV Keppra CT head within normal limits Will plan for formal evaluation including EEG as well as MRI of the brain pending clearance with AICD (noted prior MRI with AICD in place and May 2023) Neurology consulted Follow-up recommendations

## 2024-01-31 NOTE — Assessment & Plan Note (Signed)
 Baseline history of left bundle branch block status post AICD Sinus rhythm on EKG Monitor

## 2024-01-31 NOTE — ED Notes (Signed)
 CCMD contacted

## 2024-01-31 NOTE — Assessment & Plan Note (Signed)
 Noted baseline end-stage dementia Continue Seroquel

## 2024-01-31 NOTE — ED Provider Notes (Addendum)
 [facility] Provider Note    Event Date/Time   First MD Initiated Contact with Patient 01/31/24 [phone]     (approximate)   History   Altered Mental Status   HPI  Katrina Rose is a 63 y.o. female with a history of advanced dementia who [place] for witnessed seizure-like activity that occurred this morning while eating breakfast.  Patient reportedly had few jerking spells that were witnessed followed by what sounds like a postictal state where she was less responsive than normal.  She does arrive with a signed DNR.  She shakes her head when asked if anything is hurting her she feels weak.  On review of medical record she had a similar presentation in January of this year.  No reported history of seizure.  She denies any chest pain.     Physical Exam   Triage Vital Signs: ED Triage Vitals  Encounter Vitals Group     BP      Systolic BP Percentile      Diastolic BP Percentile      Pulse      Resp      Temp      Temp src      SpO2      Weight      Height      Head Circumference      Peak Flow      Pain Score      Pain Loc      Pain Education      Exclude from Growth Chart     Most recent vital signs: Vitals:   01/31/24 0736  BP: 101/63  Pulse: 85  Resp: 18  Temp: 97.8 F (36.6 C)  SpO2: 100%     Constitutional: Alert  Eyes: Conjunctivae are normal.  Head: Atraumatic. Nose: No congestion/rhinnorhea. Mouth/Throat: Mucous membranes are moist.   Neck: Painless ROM.  Cardiovascular:   Good peripheral circulation. Respiratory: Normal respiratory effort.  No retractions.  Gastrointestinal: Soft and nontender.  Musculoskeletal:  no deformity Neurologic: Limited exam due to dementia but does cross midline.  Sensation intact throughout.  Slow to follow two-step commands.  No facial droop. Skin:  Skin is warm, dry and intact. No rash noted. Psychiatric: Mood and affect are normal. Speech and behavior are  normal.    ED Results / Procedures / Treatments   Labs (all labs ordered are listed, but only abnormal results are displayed) Labs Reviewed  CBC WITH DIFFERENTIAL/PLATELET - Abnormal; Notable for the following components:      Result Value   Hemoglobin 11.8 (*)    RDW 15.8 (*)    All other components within normal limits  COMPREHENSIVE METABOLIC PANEL WITH GFR - Abnormal; Notable for the following components:   Glucose, Bld 100 (*)    All other components within normal limits  CBG MONITORING, ED  TROPONIN I (HIGH SENSITIVITY)  TROPONIN I (HIGH SENSITIVITY)     EKG  ED ECG REPORT I, Willy Eddy, the attending physician, personally viewed and interpreted this ECG.   Date: 01/31/2024  EKG Time: 7:52  Rate: 90  Rhythm: sinus  Axis: normal  Intervals: normal  ST&T Change: no stemi    RADIOLOGY Mix one tablespoon with 8oz of your favorite juice or water every day until you are having soft formed stools. Then start taking once daily if you didn't have a stool the day before.    PROCEDURES:  Critical Care performed:  Procedures   MEDICATIONS ORDERED IN ED: Medications  levETIRAcetam (KEPPRA) IVPB 1000 mg/100 mL premix (1,000 mg Intravenous New Bag/Given 01/31/24 1133)  sodium chloride 0.9 % bolus 250 mL (0 mLs Intravenous Stopped 01/31/24 1131)  LORazepam (ATIVAN) injection 2 mg (2 mg Intravenous Given 01/31/24 1132)     IMPRESSION / MDM / ASSESSMENT AND PLAN / ED COURSE  I reviewed the triage vital signs and the nursing notes.                              Differential diagnosis includes, but is not limited to, Dehydration, sepsis, pna, uti, hypoglycemia, cva, drug effect, withdrawal, encephalitis  Patient presenting to the ER for evaluation of symptoms as described above.  Based on symptoms, risk factors and considered above differential, this presenting complaint could reflect a potentially life-threatening illness therefore the patient will be placed on  continuous pulse oximetry and telemetry for monitoring.  Laboratory evaluation will be sent to evaluate for the above complaints.      Clinical Course as of 01/31/24 1135  Sun Jan 31, 2024  0800 Chest x-ray on my review and interpretation without evidence of pneumothorax. [PR]  0830 Was able to speak with the patient's sister POA who reports that she was told that the patient was unresponsive but that the staff then gave her medications.  Her workup here is reassuring.  Daughter who is a Engineer, civil (consulting) here is coming to see patient at bedside which point we will get a better sense as to whether this is her baseline or not. [PR]  0946 Pete troponin neck.  Pacemaker interrogation with no sign of dysfunction settings appear normal.  Patient resting comfortably.  Does appear appropriate for discharge back to facility. [PR]  1135 While awaiting transport back patient was stable until she had a witnessed generalized tonic-clonic seizure with postictal period.  Patient was given Ativan as well as Keppra ordered.  Family is agreeable to admission for further workup. [PR]    Clinical Course User Index [PR] Willy Eddy, MD     FINAL CLINICAL IMPRESSION(S) / ED DIAGNOSES   Final diagnoses:  Altered mental status, unspecified altered mental status type     Rx / DC Orders   ED Discharge Orders     None        Note:  This document was prepared using Dragon voice recognition software and may include unintentional dictation errors.    Willy Eddy, MD 01/31/24 1610    Willy Eddy, MD 01/31/24 [phone]

## 2024-01-31 NOTE — Assessment & Plan Note (Signed)
 2D echo May 2024 with a EF of 40 to 45% grade 1 diastolic function Appears euvolemic Monitor

## 2024-01-31 NOTE — ED Notes (Signed)
 Waiting on transport plan. [facility] unavailable

## 2024-02-01 ENCOUNTER — Observation Stay

## 2024-02-01 DIAGNOSIS — K219 Gastro-esophageal reflux disease without esophagitis: Secondary | ICD-10-CM | POA: Diagnosis present

## 2024-02-01 DIAGNOSIS — Z66 Do not resuscitate: Secondary | ICD-10-CM | POA: Diagnosis present

## 2024-02-01 DIAGNOSIS — F03918 Unspecified dementia, unspecified severity, with other behavioral disturbance: Secondary | ICD-10-CM | POA: Diagnosis present

## 2024-02-01 DIAGNOSIS — G9340 Encephalopathy, unspecified: Secondary | ICD-10-CM | POA: Diagnosis not present

## 2024-02-01 DIAGNOSIS — F17210 Nicotine dependence, cigarettes, uncomplicated: Secondary | ICD-10-CM | POA: Diagnosis present

## 2024-02-01 DIAGNOSIS — E785 Hyperlipidemia, unspecified: Secondary | ICD-10-CM | POA: Diagnosis present

## 2024-02-01 DIAGNOSIS — F0390 Unspecified dementia without behavioral disturbance: Secondary | ICD-10-CM

## 2024-02-01 DIAGNOSIS — Z882 Allergy status to sulfonamides status: Secondary | ICD-10-CM | POA: Diagnosis not present

## 2024-02-01 DIAGNOSIS — G40409 Other generalized epilepsy and epileptic syndromes, not intractable, without status epilepticus: Secondary | ICD-10-CM | POA: Diagnosis present

## 2024-02-01 DIAGNOSIS — I110 Hypertensive heart disease with heart failure: Secondary | ICD-10-CM | POA: Diagnosis present

## 2024-02-01 DIAGNOSIS — R569 Unspecified convulsions: Secondary | ICD-10-CM | POA: Diagnosis present

## 2024-02-01 DIAGNOSIS — I5022 Chronic systolic (congestive) heart failure: Secondary | ICD-10-CM | POA: Diagnosis present

## 2024-02-01 DIAGNOSIS — Z8249 Family history of ischemic heart disease and other diseases of the circulatory system: Secondary | ICD-10-CM | POA: Diagnosis not present

## 2024-02-01 DIAGNOSIS — G4700 Insomnia, unspecified: Secondary | ICD-10-CM | POA: Diagnosis present

## 2024-02-01 DIAGNOSIS — Z95810 Presence of automatic (implantable) cardiac defibrillator: Secondary | ICD-10-CM | POA: Diagnosis not present

## 2024-02-01 DIAGNOSIS — Z79899 Other long term (current) drug therapy: Secondary | ICD-10-CM | POA: Diagnosis not present

## 2024-02-01 NOTE — Discharge Summary (Incomplete)
 Physician Discharge Summary   Patient: Katrina Rose MRN: [id number] DOB: Jan 09, 1961  Admit date:     01/31/2024  Discharge date: 02/01/24  Discharge Physician: Jonah Blue   PCP: Smiley Houseman, NP   Recommendations at discharge:   You are being discharged back to your facility with home hospice Stop taking buproprion/Wellbutrin, as this medication can lower the seizure threshold (making you more likely to have a seizure) You are being discharged with Keppra to prevent further seizures Follow up with neurology in 4 weeks  Discharge Diagnoses: Principal Problem:   Seizure (HCC) Active Problems:   Dementia with behavioral disturbance (HCC)   Hypertension   Chronic systolic CHF (congestive heart failure), NYHA class 3 (HCC)   AICD (automatic cardioverter/defibrillator) present   Hospital Course: 63yo with h/o end-stage dementia, HTN, and chronic systolic CHF with AICD who presented on 3/30 fro SNF with AMS and lethargy and subsequently had a GTC seizure in the ER.  She was loaded with Keppra and given Ativan.  She is DNR.    Assessment and Plan:  Seizure  Generalized encephalopathy at nursing home today with noted witnessed generalized tonic-clonic seizure in the ER Status post Ativan as well as IV Keppra CT head within normal limits; repeat is also nonacute with frontal predominant atrophy and chronic left frontal infarct involving cortex Neurology consulted EEG with epileptogenicity arising from L frontal region without frank seizure activity Will need ongoing seizure medications    AICD (automatic cardioverter/defibrillator) present Baseline history of left bundle branch block status post AICD Sinus rhythm on EKG Unable to have MRI at this time   Chronic systolic CHF (congestive heart failure), NYHA class 3  2D echo May 2024 with a EF of 40 to 45% grade 1 diastolic function Appears euvolemic Continue bicarb for now, but this may be able to be discontinued    Hypertension BP stable Not on home medications  HLD She has little utility in continuing atorvastatin at this time Discussed with sister, who would like to continue for now Consider dc in the future  GERD Continue PPI and Zenpep She still gets joy from eating; continue regular diet with pleasure feeding   Dementia with behavioral disturbance  Noted baseline end-stage dementia Will need to stop Wellbutrin, as this can lower the seizure threshold; her sister does not think she needs an additional medication for her mood (she started it to stop smoking) Can continue Seroquel for now Will also continue rivastigmine, although these medications tend to decrease time before needing to go into a facility - which is moot at this time - and have little other proven benefit and so discontinuation should be considered They have discussed the idea of comfort care but are not quite there yet They have tried to get hospice care for her before - [employer] authorized 6 weeks of home health nursing - and are open to this now; will order home hospice     Consultants: Neurology [facility] team  Procedures: EEG 3/31  Antibiotics: None       Pain control - [facility] Controlled Substance Reporting System database was reviewed. and patient was instructed, not to drive, operate heavy machinery, perform activities at heights, swimming or participation in water activities or provide baby-sitting services while on Pain, Sleep and Anxiety Medications; until their outpatient Physician has advised to do so again. Also recommended to not to take more than prescribed Pain, Sleep and Anxiety Medications.    Disposition: Skilled nursing facility Diet recommendation:  Regular  diet DISCHARGE MEDICATION: Allergies as of 02/01/2024       Reactions   Sulfa Antibiotics Hives     Med Rec must be completed prior to using this SMARTLINK***       Follow-up Information     Smiley Houseman, NP.    Specialty: Nurse Practitioner Contact information: [address] [place] [address] [phone]                Discharge Exam:   Subjective: Alert, not effectively able to answer questions.   Objective: Vitals:   01/31/24 2031 02/01/24 0409  BP: 109/72 104/67  Pulse: 89 90  Resp:  19  Temp: 97.8 F (36.6 C) 98.3 F (36.8 C)  SpO2: 100% 93%    Intake/Output Summary (Last 24 hours) at 02/01/2024 0754 Last data filed at 02/01/2024 5188 Gross per 24 hour  Intake 1305.27 ml  Output --  Net 1305.27 ml   Filed Weights   01/31/24 1241 01/31/24 2308  Weight: 56.2 kg 46.5 kg    Exam:  General:  Appears calm and comfortable and is in NAD, older than stated age Eyes:  normal lids, iris ENT:  grossly normal hearing, lips & tongue, mmm Cardiovascular:  RRR.  No LE edema.  Respiratory:   CTA bilaterally with no wheezes/rales/rhonchi.  Normal respiratory effort. Abdomen:  soft, NT, ND Skin:  no rash or induration seen on limited exam Musculoskeletal:   generalized weakness, no bony abnormality Psychiatric:  pleasant affect, speech scant Neurologic:  grossly nonfocal  Data Reviewed: I have reviewed the patient's lab results since admission.  Pertinent labs for today include:  Essentially normal CMP on presentation Stable CBC on presentation HS troponin 4, 5     Condition at discharge: stable  The results of significant diagnostics from this hospitalization (including imaging, microbiology, ancillary and laboratory) are listed below for reference.   Imaging Studies: DG Chest Portable 1 View Result Date: 01/31/2024 CLINICAL DATA:  Syncope. EXAM: PORTABLE CHEST 1 VIEW COMPARISON:  02/20/2017 FINDINGS: There is a left chest wall ICD with leads in the right atrial appendage, coronary sinus and right ventricle. Heart size and mediastinal contours appear normal. No signs of pleural effusion, interstitial edema or airspace consolidation. Visualized  osseous structures appear grossly intact. IMPRESSION: No acute cardiopulmonary abnormalities. Electronically Signed   By: Signa Kell M.D.   On: 01/31/2024 08:21   CT HEAD WO CONTRAST ( ) Result Date: 01/31/2024 CLINICAL DATA:  Seizure, new onset, no history of trauma EXAM: CT HEAD WITHOUT CONTRAST TECHNIQUE: Contiguous axial images were obtained from the base of the skull through the vertex without intravenous contrast. RADIATION DOSE REDUCTION: This exam was performed according to the departmental dose-optimization program which includes automated exposure control, adjustment of the mA and/or kV according to patient size and/or use of iterative reconstruction technique. COMPARISON:  11/20/2023 FINDINGS: Brain: No evidence of acute infarction, hemorrhage, hydrocephalus, extra-axial collection or mass lesion/mass effect. Frontal predominant atrophy in this patient with history of dementia. White matter low-density is also greater in the frontal lobes. Vascular: No hyperdense vessel or unexpected calcification. Skull: Normal. Negative for fracture or focal lesion. Sinuses/Orbits: No acute finding. IMPRESSION: No acute finding. Bifrontal atrophy. Electronically Signed   By: Tiburcio Pea M.D.   On: 01/31/2024 08:14  Labs: CBC: Recent Labs  Lab 01/31/24 0746  WBC 4.0  NEUTROABS 2.3  HGB 11.8*  HCT 36.8  MCV 87.8  PLT 171   Basic Metabolic Panel: Recent Labs  Lab  01/31/24 0746  NA 136  K 3.7  CL 100  CO2 26  GLUCOSE 100*  BUN 13  CREATININE 0.98  CALCIUM 9.2   Liver Function Tests: Recent Labs  Lab 01/31/24 0746  AST 21  ALT 16  ALKPHOS 91  BILITOT 0.3  PROT 7.3  ALBUMIN 3.7   CBG: Recent Labs  Lab 01/31/24 0740  GLUCAP 95    Discharge time spent: greater than 30 minutes.  Signed: Jonah Blue, MD [facility] 02/01/2024

## 2024-02-01 NOTE — Evaluation (Signed)
 Clinical/Bedside Swallow Evaluation Patient Details  Name: Katrina Rose MRN: [id number] Date of Birth: [birth year]  Today's Date: 02/01/2024 Time: SLP Start Time (ACUTE ONLY): 0900 SLP Stop Time (ACUTE ONLY): 0925 SLP Time Calculation (min) (ACUTE ONLY): 25 min  Past Medical History:  Past Medical History:  Diagnosis Date   AICD (automatic cardioverter/defibrillator) present    CHF (congestive heart failure) (HCC)    Dementia (HCC)    GERD (gastroesophageal reflux disease)    Hypertension    IDA (iron deficiency anemia)    Insomnia    LBBB (left bundle branch block)    Past Surgical History:  Past Surgical History:  Procedure Laterality Date   ANKLE SURGERY Left    CARDIAC CATHETERIZATION     COLONOSCOPY     COLONOSCOPY WITH PROPOFOL N/A 10/07/2021   Procedure: COLONOSCOPY WITH PROPOFOL;  Surgeon: Jaynie Collins, DO;  Location: [facility] ENDOSCOPY;  Service: Gastroenterology;  Laterality: N/A;   debridement and closure sternal wound     DILATION AND CURETTAGE OF UTERUS     ESOPHAGOGASTRODUODENOSCOPY     ESOPHAGOGASTRODUODENOSCOPY (EGD) WITH PROPOFOL N/A 10/07/2021   Procedure: ESOPHAGOGASTRODUODENOSCOPY (EGD) WITH PROPOFOL;  Surgeon: Jaynie Collins, DO;  Location: [facility] ENDOSCOPY;  Service: Gastroenterology;  Laterality: N/A;   ESOPHAGOGASTRODUODENOSCOPY (EGD) WITH PROPOFOL N/A 03/22/2023   Procedure: ESOPHAGOGASTRODUODENOSCOPY (EGD) WITH PROPOFOL;  Surgeon: [name], MD;  Location: [facility] ENDOSCOPY;  Service: Gastroenterology;  Laterality: N/A;   ICD GENERATOR CHANGEOUT N/A 08/01/2021   Procedure: ICD GENERATOR CHANGEOUT;  Surgeon: Marcina Millard, MD;  Location: [facility] INVASIVE CV LAB;  Service: Cardiovascular;  Laterality: N/A;   pacermaker/ defibrilater   2010   TUBAL LIGATION     HPI:  Katrina Rose is a 63 y.o. female with medical history significant of end-stage dementia, GERD, hypertension, left bundle branch block status post AICD, heart failure  presenting with seizure.  Limited history in the setting of encephalopathy.  Per report, patient was found lethargic at skilled nursing facility.  Lethargy persisted over the course of the morning and patient was subsequently sent to the ER for further evaluation.  Patient noted to be DNR at baseline.  After fairly benign workup in the ER, patient had a witnessed generalized tonic-clonic seizure lasting roughly 1 to 1-1/2 minutes.  Was given Ativan as well as IV Keppra for management. MRI Brain 3/30: No evidence of acute infarction, hemorrhage, hydrocephalus,  extra-axial collection or mass lesion/mass effect. Frontal  predominant atrophy in this patient with history of dementia. White  matter low-density is also greater in the frontal lobes. DG Chest 3/30: No acute cardiopulmonary abnormalities.    Assessment / Plan / Recommendation  Clinical Impression  Pt seen for bedside swallow assessment in the setting of altered mental status secondary to seizure, generalized encephalopathy, and baseline dementia. Supportive daughter present at the bedside, who reported baseline regular diet and pt taking medications crushed in puree (secondary to tendency to orally hold medication). No upper dentition, adequate lower dentition. No focal oral motor deficits. Pt seen with trials of ice, thin liquids (via straw), puree, and regular solids. No overt or subtle s/sx pharyngeal dysphagia noted. No change to vocal quality across trials. Oral phase grossly intact- with complete manipulation and clearance of regular solid from oral cavity with min extended time.   Based on cognitive decline and current debility, pt is at increased risk of aspiration, therefore recommend aspiration precautions (slow rate, small bites, elevated HOB, and alert for PO intake). Pt to benefit from liquid  wash and intermittent visual inspection to assess for oral holding. Assist recommended for feeding. Encourage pt to hold cup/utensil to increase  engagement in task and reduce distractions. Recommend regular solids and thin liquids- medications crushed in puree. No follow up SLP services indicated. MD and RN aware of recommendations.  SLP Visit Diagnosis: Dysphagia, unspecified (R13.10)    Aspiration Risk  Mild aspiration risk    Diet Recommendation   Age appropriate regular;Thin  Medication Administration: Crushed with puree    Other  Recommendations Oral Care Recommendations: Oral care BID;Staff/trained caregiver to provide oral care    Recommendations for follow up therapy are one component of a multi-disciplinary discharge planning process, led by the attending physician.  Recommendations may be updated based on patient status, additional functional criteria and insurance authorization.  Follow up Recommendations No SLP follow up      Assistance Recommended at Discharge  Assist for completion of PO intake  Functional Status Assessment Patient has not had a recent decline in their functional status (in regards to oropharyngeal function)    Swallow Study   General Date of Onset: 02/01/24 HPI: Katrina Rose is a 63 y.o. female with medical history significant of end-stage dementia, GERD, hypertension, left bundle branch block status post AICD, heart failure presenting with seizure.  Limited history in the setting of encephalopathy.  Per report, patient was found lethargic at skilled nursing facility.  Lethargy persisted over the course of the morning and patient was subsequently sent to the ER for further evaluation.  Patient noted to be DNR at baseline.  After fairly benign workup in the ER, patient had a witnessed generalized tonic-clonic seizure lasting roughly 1 to 1-1/2 minutes.  Was given Ativan as well as IV Keppra for management. MRI Brain 3/30: No evidence of acute infarction, hemorrhage, hydrocephalus,  extra-axial collection or mass lesion/mass effect. Frontal  predominant atrophy in this patient with history of  dementia. White  matter low-density is also greater in the frontal lobes. DG Chest 3/30: No acute cardiopulmonary abnormalities. Type of Study: Bedside Swallow Evaluation Previous Swallow Assessment: none in chart Diet Prior to this Study: NPO Temperature Spikes Noted: No (Temp 98.3; WBC 4.0) Respiratory Status: Room air History of Recent Intubation: No Behavior/Cognition: Alert;Confused;Cooperative Oral Cavity Assessment: Within Functional Limits Oral Care Completed by SLP: Yes Oral Cavity - Dentition: Missing dentition (missing bottom dentition) Vision: Functional for self-feeding Self-Feeding Abilities: Needs assist;Needs set up Patient Positioning: Upright in bed Baseline Vocal Quality: Normal Volitional Cough: Cognitively unable to elicit Volitional Swallow: Unable to elicit    Oral/Motor/Sensory Function Overall Oral Motor/Sensory Function: Within functional limits   Ice Chips Ice chips: Within functional limits Presentation: Spoon   Thin Liquid Thin Liquid: Within functional limits Presentation: Straw    Nectar Thick Nectar Thick Liquid: Not tested   Honey Thick Honey Thick Liquid: Not tested   Puree Puree: Within functional limits Presentation: Spoon (assist provided)   Solid     Solid: Within functional limits Presentation: Self Fed (with extended time)     [place] Chaz Ronning Clapp, MS, CCC-SLP Speech Language Pathologist Rehab Services; [facility] [phone] (ascom)   [place] J Clapp 02/01/2024,9:48 AM

## 2024-02-01 NOTE — Consult Note (Signed)
 NEUROLOGY CONSULT NOTE   Date of service: February 01, 2024 Patient Name: Katrina Rose MRN:  [id number] DOB:  Aug 31, 1961 Chief Complaint:  Seizure Requesting Provider: Jonah Blue, MD  History of Present Illness  Katrina Rose is a 63 y.o. female with a PMHx of early-onset rapidly progressive dementia, CHF, LBBB, AICD, GERD, HTN, iron deficiency anemia, prior history of heavy EtOH use and insomnia who presented via EMS to the ED yesterday morning after several episodes of "jerking motions" each followed by decreased responsiveness suggestive of postictal state. On arrival to the ED, the patient could shake her head no when asked if she was hurting anywhere, and also could say she felt weak. She did have a similar presentation in January of this year. She has no prior history of seizures per daughter. She is one of 3 female siblings, one other of whom also has a history of rapidly progressive, early onset dementia.   While in the ED, she had a witnessed GTC seizure lasting for about 1-1.5 minutes, followed by a postictal period. She was administered Ativan and also loaded with Keppra   ROS  Unable to ascertain due to the patient's dementia.   Past History   Past Medical History:  Diagnosis Date   AICD (automatic cardioverter/defibrillator) present    CHF (congestive heart failure) (HCC)    Dementia (HCC)    GERD (gastroesophageal reflux disease)    Hypertension    IDA (iron deficiency anemia)    Insomnia    LBBB (left bundle branch block)     Past Surgical History:  Procedure Laterality Date   ANKLE SURGERY Left    CARDIAC CATHETERIZATION     COLONOSCOPY     COLONOSCOPY WITH PROPOFOL N/A 10/07/2021   Procedure: COLONOSCOPY WITH PROPOFOL;  Surgeon: Jaynie Collins, DO;  Location: [facility] ENDOSCOPY;  Service: Gastroenterology;  Laterality: N/A;   debridement and closure sternal wound     DILATION AND CURETTAGE OF UTERUS     ESOPHAGOGASTRODUODENOSCOPY      ESOPHAGOGASTRODUODENOSCOPY (EGD) WITH PROPOFOL N/A 10/07/2021   Procedure: ESOPHAGOGASTRODUODENOSCOPY (EGD) WITH PROPOFOL;  Surgeon: Jaynie Collins, DO;  Location: [facility] ENDOSCOPY;  Service: Gastroenterology;  Laterality: N/A;   ESOPHAGOGASTRODUODENOSCOPY (EGD) WITH PROPOFOL N/A 03/22/2023   Procedure: ESOPHAGOGASTRODUODENOSCOPY (EGD) WITH PROPOFOL;  Surgeon: [name], MD;  Location: [facility] ENDOSCOPY;  Service: Gastroenterology;  Laterality: N/A;   ICD GENERATOR CHANGEOUT N/A 08/01/2021   Procedure: ICD GENERATOR CHANGEOUT;  Surgeon: Marcina Millard, MD;  Location: [facility] INVASIVE CV LAB;  Service: Cardiovascular;  Laterality: N/A;   pacermaker/ defibrilater   2010   TUBAL LIGATION      Family History: Family History  Problem Relation Age of Onset   CAD Mother    Colon cancer Father     Social History  reports that she has been smoking cigarettes. She has never used smokeless tobacco. She reports current alcohol use. She reports that she does not use drugs.  Allergies  Allergen Reactions   Sulfa Antibiotics Hives    Medications   Current Facility-Administered Medications:    enoxaparin (LOVENOX) injection 40 mg, 40 mg, Subcutaneous, Q24H, Floydene Flock, MD, 40 mg at 01/31/24 2239   levETIRAcetam (KEPPRA) tablet 500 mg, 500 mg, Oral, BID, Jefferson Fuel, MD, 500 mg at 02/01/24 1112   LORazepam (ATIVAN) injection 4 mg, 4 mg, Intravenous, Q5 Min x 2 PRN, Floydene Flock, MD   ondansetron [facility]) tablet 4 mg, 4 mg, Oral, Q6H PRN **OR** ondansetron (ZOFRAN) injection  4 mg, 4 mg, Intravenous, Q6H PRN, Floydene Flock, MD   Oral care mouth rinse, 15 mL, Mouth Rinse, PRN, Floydene Flock, MD   pantoprazole (PROTONIX) EC tablet 40 mg, 40 mg, Oral, Daily, Floydene Flock, MD, 40 mg at 02/01/24 1112  Vitals   Vitals:   02-05-24 2031 2024-02-05 2308 02/01/24 0409 02/01/24 0805  BP: 109/72  104/67 96/72  Pulse: 89  90 83  Resp:   19 17  Temp: 97.8 F (36.6 C)  98.3 F  (36.8 C) 98.1 F (36.7 C)  TempSrc:   Oral   SpO2: 100%  93% 98%  Weight:  46.5 kg    Height:  5\' 2"  (1.575 m)      Body mass index is 18.75 kg/m.  Physical Exam   Physical Exam HEENT- Katrina Rose/AT   Lungs- Respirations unlabored Extremities- Warm and well-perfused  Neurological Examination Mental Status: Awake and alert, but with abulia/apathy. Speech is sparse, consisting of one-word replies to questions. Able to state her first name, her daughter's first name, count two fingers and show examiner one finger when asked. Only follows some simple commands. Not oriented to situation, place or time.  Cranial Nerves: II: Pupils are equal in ambient light, but does not cooperate with attempts to assess pupillary light reflex.  III,IV, VI: No ptosis. Eyes are conjugate and she will gaze at examiner and her daughter when spoken to. Does not cooperate with formal testing of EOM.  V: Reacts to touch bilaterally VII: Smile symmetric VIII: Hearing intact to voice IX,X: Gag reflex deferred XI: Not cooperative XII: Not cooperative Motor: Moves BUE equally to request, but does not resist with full effort. Slow movements noted.  Brisk withdrawal of BLE to noxious plantar stimulation.  Sensory: Reacts to touch x 4 Deep Tendon Reflexes: The patient does not cooperate with assessment of reflexes Plantars: Right: downgoingLeft: downgoing Cerebellar: No ataxia with spontaneous movements.  Gait: Deferred  Labs/Imaging/Neurodiagnostic studies   CBC:  Recent Labs  Lab 02/05/2024 0746  WBC 4.0  NEUTROABS 2.3  HGB 11.8*  HCT 36.8  MCV 87.8  PLT 171   Basic Metabolic Panel:  Lab Results  Component Value Date   NA 136 02-05-24   K 3.7 February 05, 2024   CO2 26 02/05/24   GLUCOSE 100 (H) 2024/02/05   BUN 13 02/05/24   CREATININE 0.98 02-05-2024   CALCIUM 9.2 05-Feb-2024   GFRNONAA >60 Feb 05, 2024   GFRAA >60 02/20/2017   Lipid Panel: No results found for: "LDLCALC" HgbA1c: No results found  for: "HGBA1C" Urine Drug Screen:     Component Value Date/Time   LABOPIA NONE DETECTED 11/07/2021 1047   COCAINSCRNUR NONE DETECTED 11/07/2021 1047   LABBENZ NONE DETECTED 11/07/2021 1047   AMPHETMU NONE DETECTED 11/07/2021 1047   THCU NONE DETECTED 11/07/2021 1047   LABBARB NONE DETECTED 11/07/2021 1047    Alcohol Level No results found for: "ETH" INR  Lab Results  Component Value Date   INR 0.9 09/21/2014   APTT  Lab Results  Component Value Date   APTT 26.7 09/21/2014     ASSESSMENT  FAYETTA SORENSON is a 63 y.o. female with early-onset rapidly progressive dementia, now in a state of advanced dementia, who presents with recurrent seizure activity. She had a similar presentation in January, but was not on an anticonvulsant on presentation this admission. She has no other prior seizure history.  - Exam reveals findings consistent with advanced dementia. The patient is nearly nonverbal, but awake  and can follow some simple commands and state her name, as well as her daughter's name.  - Labs on arrival were essentially unremarkable.  - EEG: Left frontal sharp waves and continuous generalized slowing. This study showed evidence of epileptogenicity arising from the left frontal region. Additionally there is moderate diffuse encephalopathy.  No seizures were seen throughout the recording. - CT head: Frontal predominant atrophy and chronic left frontal infarct involving the cortex. No acute finding. - Impression: New onset seizures, most likely secondary to advanced dementia.  RECOMMENDATIONS  - Continue with Keppra 500 mg BID, indefinitely - Inpatient and outpatient seizure precautions.  - Unable to obtain MRI due to ICD - Outpatient Neurology follow up.  - Neurohospitalist service will sign off. Please call if there are additional questions.   ______________________________________________________________________    Dessa Phi, Kunal Levario, MD [facility]

## 2024-02-01 NOTE — Progress Notes (Signed)
 Progress Note   Patient: Katrina Rose JYN:[id number] DOB: 1961/03/15 DOA: 01/31/2024     0 DOS: the patient was seen and examined on 02/01/2024   Brief hospital course: 63yo with h/o end-stage dementia, HTN, and chronic systolic CHF with AICD who presented on 3/30 fro SNF with AMS and lethargy and subsequently had a GTC seizure in the ER.  She was loaded with Keppra and given Ativan.  She is DNR.    Assessment and Plan:  Seizure  Generalized encephalopathy at nursing home today with noted witnessed generalized tonic-clonic seizure in the ER Status post Ativan as well as IV Keppra CT head within normal limits; repeat is also nonacute with frontal predominant atrophy and chronic left frontal infarct involving cortex Neurology consulted EEG with epileptogenicity arising from L frontal region without frank seizure activity Will need ongoing seizure medications    AICD (automatic cardioverter/defibrillator) present Baseline history of left bundle branch block status post AICD Sinus rhythm on EKG Unable to have MRI at this time   Chronic systolic CHF (congestive heart failure), NYHA class 3  2D echo May 2024 with a EF of 40 to 45% grade 1 diastolic function Appears euvolemic Continue bicarb for now, but this may be able to be discontinued   Hypertension BP stable Not on home medications   HLD She has little utility in continuing atorvastatin at this time Discussed with sister, who would like to continue for now Consider dc in the future   GERD Continue PPI and Zenpep She still gets joy from eating; continue regular diet with pleasure feeding   Dementia with behavioral disturbance  Noted baseline end-stage dementia Will need to stop Wellbutrin, as this can lower the seizure threshold; her sister does not think she needs an additional medication for her mood (she started it to stop smoking) Can continue Seroquel for now Will also continue rivastigmine, although these  medications tend to decrease time before needing to go into a facility - which is moot at this time - and have little other proven benefit and so discontinuation should be considered They have discussed the idea of comfort care but are not quite there yet They have tried to get hospice care for her before - [employer] authorized 6 weeks of home health nursing - and are open to this now; will order home hospice         Consultants: Neurology [facility] team   Procedures: EEG 3/31   Antibiotics: None     Subjective: Alert, not effectively able to answer questions.    Objective: Vitals:   02/01/24 0409 02/01/24 0805  BP: 104/67 96/72  Pulse: 90 83  Resp: 19 17  Temp: 98.3 F (36.8 C) 98.1 F (36.7 C)  SpO2: 93% 98%    Intake/Output Summary (Last 24 hours) at 02/01/2024 1633 Last data filed at 02/01/2024 1027 Gross per 24 hour  Intake 955.27 ml  Output 1 ml  Net 954.27 ml   Filed Weights   01/31/24 1241 01/31/24 2308  Weight: 56.2 kg 46.5 kg    Exam:  General:  Appears calm and comfortable and is in NAD, older than stated age Eyes:  normal lids, iris ENT:  grossly normal hearing, lips & tongue, mmm Cardiovascular:  RRR.  No LE edema.  Respiratory:   CTA bilaterally with no wheezes/rales/rhonchi.  Normal respiratory effort. Abdomen:  soft, NT, ND Skin:  no rash or induration seen on limited exam Musculoskeletal:   generalized weakness, no bony abnormality Psychiatric:  pleasant  affect, speech scant Neurologic:  grossly nonfocal  Data Reviewed: I have reviewed the patient's lab results since admission.  Pertinent labs for today include:   Essentially normal CMP on presentation Stable CBC on presentation HS troponin 4, 5       Family Communication: Daughter was present; I also spoke with her sister (POA) by telephone  Disposition: Status is: Observation The patient remains OBS appropriate and will d/c before 2 midnights.     Time spent: 50  minutes  Unresulted Labs (From admission, onward)    None        Author: Jonah Blue, MD 02/01/2024 4:33 PM  For on call review [email or web address].

## 2024-02-01 NOTE — Progress Notes (Signed)
 Eeg done

## 2024-02-01 NOTE — Procedures (Signed)
 Patient Name: Katrina Rose  MRN: [id number]  Epilepsy Attending: Charlsie Quest  Referring Physician/Provider: Jonah Blue, MD  Date: 02/01/2024 Duration: 27.36 mins  Patient history: 63 year old female with altered mental status.  EEG to evaluate for seizure.  Level of alertness: Awake, asleep  AEDs during EEG study: Keppra  Technical aspects: This EEG study was done with scalp electrodes positioned according to the 10-20 International system of electrode placement. Electrical activity was reviewed with band pass filter of 1-70Hz , sensitivity of 7 uV/mm, display speed of 61mm/sec with a 60Hz  notched filter applied as appropriate. EEG data were recorded continuously and digitally stored.  Video monitoring was available and reviewed as appropriate.  Description: No clear posterior dominant rhythm was seen.  Sleep was characterized by sleep spindles (12 to 14 Hz), maximal frontocentral region.  EEG showed continuous generalized 3 to 6 Hz theta-delta slowing.  Sharp waves were noted in left frontal region. Hyperventilation and photic stimulation were not performed.     ABNORMALITY -Sharp wave, left frontal region - Continuous slow, generalized  IMPRESSION: This study showed evidence of epileptogenicity arising from left frontal region.  Additionally there is moderate diffuse encephalopathy.  No seizures were seen throughout the recording.  Caidin Heidenreich Annabelle Harman

## 2024-02-01 NOTE — Plan of Care (Signed)

## 2024-02-01 NOTE — TOC Initial Note (Signed)
 Transition of Care [facility]) - Initial/Assessment Note    Patient Details  Name: Katrina Rose MRN: [id number] Date of Birth: [birth year]  [facility]) CM/SW Contact:    Cherre Blanc, RN Phone Number: 02/01/2024, 12:48 PM  Clinical Narrative:                  TOC spoke with the patients sister, she is in LTC at the [facility] [address] The plan is for the paitient to return to the facility when she is medically clear for dc. Her daughter will transport the patient back to the facility. No other TOC needs identified.       Patient Goals and CMS Choice            Expected Discharge Plan and Services                                              Prior Living Arrangements/Services                       Activities of Daily Living   ADL Screening (condition at time of admission) Independently performs ADLs?: No Does the patient have a NEW difficulty with bathing/dressing/toileting/self-feeding that is expected to last >3 days?: No Does the patient have a NEW difficulty with getting in/out of bed, walking, or climbing stairs that is expected to last >3 days?: No Does the patient have a NEW difficulty with communication that is expected to last >3 days?: No Is the patient deaf or have difficulty hearing?: No Does the patient have difficulty seeing, even when wearing glasses/contacts?: No Does the patient have difficulty concentrating, remembering, or making decisions?: No  Permission Sought/Granted                  Emotional Assessment              Admission diagnosis:  Seizure (HCC) [R56.9] Altered mental status, unspecified altered mental status type [R41.82] Patient Active Problem List   Diagnosis Date Noted   Seizure (HCC) 01/31/2024   Chronic pancreatitis (HCC) 03/23/2023   Metabolic acidosis 03/22/2023   Symptomatic anemia 03/21/2023   Anemia due to gastrointestinal blood loss, suspect chronic 03/21/2023   Dementia with  behavioral disturbance (HCC) 03/21/2023   Hypertension 03/21/2023   Acute renal failure superimposed on stage 3a chronic kidney disease (HCC) 03/21/2023   Hypotension 03/21/2023   IDA (iron deficiency anemia) 03/21/2023   AICD (automatic cardioverter/defibrillator) present 03/21/2023   LBBB (left bundle branch block) 03/21/2023   History of Clostridioides difficile infection 2017 03/21/2023   Syncope 03/21/2023   DAT (dementia of Alzheimer type) (HCC) 07/30/2022   C. difficile diarrhea 07/20/2016   Chronic systolic CHF (congestive heart failure), NYHA class 3 (HCC) 09/07/2014   Cardiomyopathy (HCC) 04/06/2014   PCP:  Smiley Houseman, NP Pharmacy:   [facility] [place], [place] - [address] [place] [address] Phone: [phone] Fax: [phone]     Social Drivers of Health (SDOH) Social History: SDOH Screenings   Food Insecurity: No Food Insecurity (03/22/2023)  Housing: Patient Unable To Answer (01/31/2024)  Transportation Needs: Patient Unable To Answer (01/31/2024)  Utilities: Patient Declined (01/31/2024)  Social Connections: Unknown (01/31/2024)  Tobacco Use: High Risk (01/31/2024)   SDOH Interventions:     Readmission Risk Interventions  No data to display

## 2024-02-01 NOTE — Care Management Obs Status (Signed)
 MEDICARE OBSERVATION STATUS NOTIFICATION   Patient Details  Name: Katrina Rose MRN: [id number] Date of Birth: 29-Jul-1961   Medicare Observation Status Notification Given:  Orland Dec, CMA 02/01/2024, 12:49 PM

## 2024-02-01 NOTE — Hospital Course (Signed)
 63yo with h/o end-stage dementia, HTN, and chronic systolic CHF with AICD who presented on 3/30 fro SNF with AMS and lethargy and subsequently had a GTC seizure in the ER.  She was loaded with Keppra and given Ativan.  She is DNR.

## 2024-02-02 DIAGNOSIS — R569 Unspecified convulsions: Secondary | ICD-10-CM | POA: Diagnosis not present

## 2024-02-02 MED ORDER — LEVETIRACETAM 500 MG PO TABS: 500.0000 mg | ORAL_TABLET | Freq: Two times a day (BID) | ORAL | Status: DC

## 2024-02-02 NOTE — Discharge Summary (Addendum)
 Physician Discharge Summary   Patient: Katrina Rose MRN: [id number] DOB: [birth year]  Admit date:     01/31/2024  Discharge date: 02/02/24  Discharge Physician: Jonah Blue   PCP: Smiley Houseman, NP   Recommendations at discharge:    You are being discharged back to your facility with home hospice Stop taking buproprion/Wellbutrin, as this medication can lower the seizure threshold (making you more likely to have a seizure) You are being discharged with Keppra to prevent further seizures Follow up with neurology in 4 weeks  Discharge Diagnoses: Principal Problem:   Seizure (HCC) Active Problems:   Dementia with behavioral disturbance (HCC)   Hypertension   Chronic systolic CHF (congestive heart failure), NYHA class 3 (HCC)   AICD (automatic cardioverter/defibrillator) present   Hospital Course: 63yo with h/o end-stage dementia, HTN, and chronic systolic CHF with AICD who presented on 3/30 fro SNF with AMS and lethargy and subsequently had a GTC seizure in the ER.  She was loaded with Keppra and given Ativan.  She is DNR.    Assessment and Plan:  Seizure  Generalized encephalopathy at nursing home today with noted witnessed generalized tonic-clonic seizure in the ER Status post Ativan as well as IV Keppra CT head within normal limits; repeat is also nonacute with frontal predominant atrophy and chronic left frontal infarct involving cortex Neurology consulted EEG with epileptogenicity arising from L frontal region without frank seizure activity Will need ongoing seizure medications, plan for Keppra 500 mg PO BID indefinitely Outpatient neurology f/u   AICD (automatic cardioverter/defibrillator) present Baseline history of left bundle branch block status post AICD Sinus rhythm on EKG Unable to have MRI due to AICD   Chronic systolic CHF (congestive heart failure), NYHA class 3  2D echo May 2024 with a EF of 40 to 45% grade 1 diastolic function Appears  euvolemic Continue bicarb for now, but this may be able to be discontinued at some point in the future   Hypertension BP stable Not on home medications   HLD She has little utility in continuing atorvastatin at this time Discussed with sister, who would like to continue for now Consider dc in the future   GERD Continue PPI and Zenpep She still gets joy from eating; continue regular diet with pleasure feeding   Dementia with behavioral disturbance  Noted baseline end-stage dementia Will need to stop Wellbutrin, as this can lower the seizure threshold; her sister does not think she needs an additional medication for her mood (she started it to stop smoking) Can continue Seroquel for now Will also continue rivastigmine, although these medications tend to decrease time before needing to go into a facility - which is moot at this time - and have little other proven benefit and so discontinuation should be considered They have discussed the idea of comfort care but are not quite there yet They have tried to get hospice care for her before - [employer] authorized 6 weeks of home health nursing - and are open to this now; will order home hospice         Consultants: Neurology [facility] team   Procedures: EEG 3/31   Antibiotics: None      Pain control - [facility] Controlled Substance Reporting System database was reviewed. and patient was instructed, not to drive, operate heavy machinery, perform activities at heights, swimming or participation in water activities or provide baby-sitting services while on Pain, Sleep and Anxiety Medications; until their outpatient Physician has advised to do so again. Also  recommended to not to take more than prescribed Pain, Sleep and Anxiety Medications.   Disposition: Home Diet recommendation:  Regular diet DISCHARGE MEDICATION: Allergies as of 02/02/2024       Reactions   Sulfa Antibiotics Hives        Medication List     STOP taking  these medications    buPROPion 150 MG 12 hr tablet Commonly known as: WELLBUTRIN SR       TAKE these medications    Anti-Diarrheal 2 MG tablet Generic drug: loperamide Take 2 mg by mouth 4 (four) times daily as needed for diarrhea or loose stools.   atorvastatin 20 MG tablet Commonly known as: LIPITOR Take 20 mg by mouth daily.   FeroSul 325 (65 FE) MG tablet Generic drug: ferrous sulfate Take 325 mg by mouth daily with breakfast.   furosemide 20 MG tablet Commonly known as: LASIX Take 20 mg by mouth daily.   hydrocortisone cream 1 % Apply 1 Application topically 2 (two) times daily. Apply Daily To Minor Skin/ Irritations Until Healed (Notify Physician If Symptoms Persist Greater Than 3 Days)   Lactobacillus 0.05-0.05 MG Tabs   levETIRAcetam 500 MG tablet Commonly known as: KEPPRA Take 1 tablet (500 mg total) by mouth 2 (two) times daily.   magnesium hydroxide 400 MG/5ML suspension Commonly known as: MILK OF MAGNESIA Take 30 mLs by mouth daily as needed for mild constipation.   Melatonin 5 MG Chew   pantoprazole 40 MG tablet Commonly known as: PROTONIX Take 40 mg by mouth daily.   Pharbetol 325 MG tablet Generic drug: acetaminophen Take 650 mg by mouth every 6 (six) hours as needed for headache.   potassium chloride 10 MEQ CR capsule Commonly known as: MICRO-K Take 10 mEq by mouth daily.   QUEtiapine 100 MG tablet Commonly known as: SEROQUEL Take 1 tablet (100 mg total) by mouth at bedtime.   rivastigmine 3 MG capsule Commonly known as: EXELON Take 3 mg by mouth daily.   sodium bicarbonate 650 MG tablet Take 650 mg by mouth 2 (two) times daily.   Vitamin D3 25 MCG (1000 UT) Caps Take by mouth daily.   Zenpep 25000-79000 units Cpep Generic drug: Pancrelipase (Lip-Prot-Amyl) Take 25,000 Units by mouth in the morning, at noon, and at bedtime.        Follow-up Information     Smiley Houseman, NP .   Specialty: Nurse Practitioner Contact  information: [address] [place] [address] [phone]                Discharge Exam:   Subjective: More interactive today.  No reported complaints.   Objective: Vitals:   02/02/24 0445 02/02/24 0819  BP: 110/71 99/73  Pulse: 69 75  Resp: 17 18  Temp: 97.8 F (36.6 C) 98.2 F (36.8 C)  SpO2: 99% 100%   No intake or output data in the 24 hours ending 02/02/24 1057  Filed Weights   01/31/24 1241 01/31/24 2308  Weight: 56.2 kg 46.5 kg    Exam:  General:  Appears calm and comfortable and is in NAD, older than stated age Eyes:  normal lids, iris ENT:  grossly normal hearing, lips & tongue, mmm Cardiovascular:  RRR.  No LE edema.  Respiratory:   CTA bilaterally with no wheezes/rales/rhonchi.  Normal respiratory effort. Abdomen:  soft, NT, ND Skin:  no rash or induration seen on limited exam Musculoskeletal:   generalized weakness, no bony abnormality Psychiatric:  pleasant affect, speech  scant Neurologic:  grossly nonfocal  Data Reviewed: I have reviewed the patient's lab results since admission.  Pertinent labs for today include:   None    Condition at discharge: stable  The results of significant diagnostics from this hospitalization (including imaging, microbiology, ancillary and laboratory) are listed below for reference.   Imaging Studies: EEG adult Result Date: 02/01/2024 Charlsie Quest, MD     02/01/2024  2:36 PM Patient Name: Katrina Rose MRN: [id number] Epilepsy Attending: Charlsie Quest Referring Physician/Provider: Jonah Blue, MD Date: 02/01/2024 Duration: 27.36 mins Patient history: 63 year old female with altered mental status.  EEG to evaluate for seizure. Level of alertness: Awake, asleep AEDs during EEG study: Keppra Technical aspects: This EEG study was done with scalp electrodes positioned according to the 10-20 International system of electrode placement. Electrical activity was reviewed with band pass filter  of 1-70Hz , sensitivity of 7 uV/mm, display speed of 78mm/sec with a 60Hz  notched filter applied as appropriate. EEG data were recorded continuously and digitally stored.  Video monitoring was available and reviewed as appropriate. Description: No clear posterior dominant rhythm was seen.  Sleep was characterized by sleep spindles (12 to 14 Hz), maximal frontocentral region.  EEG showed continuous generalized 3 to 6 Hz theta-delta slowing.  Sharp waves were noted in left frontal region. Hyperventilation and photic stimulation were not performed.   ABNORMALITY -Sharp wave, left frontal region - Continuous slow, generalized IMPRESSION: This study showed evidence of epileptogenicity arising from left frontal region.  Additionally there is moderate diffuse encephalopathy.  No seizures were seen throughout the recording. Priyanka Annabelle Harman   CT HEAD WO CONTRAST ( ) Result Date: 02/01/2024 CLINICAL DATA:  Seizure new onset, no history of trauma. AICD limits MRI, follow-up scan EXAM: CT HEAD WITHOUT CONTRAST TECHNIQUE: Contiguous axial images were obtained from the base of the skull through the vertex without intravenous contrast. RADIATION DOSE REDUCTION: This exam was performed according to the departmental dose-optimization program which includes automated exposure control, adjustment of the mA and/or kV according to patient size and/or use of iterative reconstruction technique. COMPARISON:  Head CT from yesterday FINDINGS: Brain: No evidence of acute infarction, obstructive hemorrhage, hydrocephalus, extra-axial collection or mass lesion/mass effect. Generalized brain atrophy. Chronic left frontal infarct affecting a small area of cortex. Ventriculomegaly primarily attributed to atrophy given the degree of volume loss. Vascular: No hyperdense vessel or unexpected calcification. Skull: Normal. Negative for fracture or focal lesion. Sinuses/Orbits: No acute finding. IMPRESSION: No acute finding. Frontal predominant  atrophy and chronic left frontal infarct involving cortex. Electronically Signed   By: Tiburcio Pea M.D.   On: 02/01/2024 12:42   DG Chest Portable 1 View Result Date: 01/31/2024 CLINICAL DATA:  Syncope. EXAM: PORTABLE CHEST 1 VIEW COMPARISON:  02/20/2017 FINDINGS: There is a left chest wall ICD with leads in the right atrial appendage, coronary sinus and right ventricle. Heart size and mediastinal contours appear normal. No signs of pleural effusion, interstitial edema or airspace consolidation. Visualized osseous structures appear grossly intact. IMPRESSION: No acute cardiopulmonary abnormalities. Electronically Signed   By: Signa Kell M.D.   On: 01/31/2024 08:21   CT HEAD WO CONTRAST ( ) Result Date: 01/31/2024 CLINICAL DATA:  Seizure, new onset, no history of trauma EXAM: CT HEAD WITHOUT CONTRAST TECHNIQUE: Contiguous axial images were obtained from the base of the skull through the vertex without intravenous contrast. RADIATION DOSE REDUCTION: This exam was performed according to the departmental dose-optimization program which includes automated exposure control, adjustment of the mA  and/or kV according to patient size and/or use of iterative reconstruction technique. COMPARISON:  11/20/2023 FINDINGS: Brain: No evidence of acute infarction, hemorrhage, hydrocephalus, extra-axial collection or mass lesion/mass effect. Frontal predominant atrophy in this patient with history of dementia. White matter low-density is also greater in the frontal lobes. Vascular: No hyperdense vessel or unexpected calcification. Skull: Normal. Negative for fracture or focal lesion. Sinuses/Orbits: No acute finding. IMPRESSION: No acute finding. Bifrontal atrophy. Electronically Signed   By: Tiburcio Pea M.D.   On: 01/31/2024 08:14    Labs: CBC: Recent Labs  Lab 01/31/24 0746  WBC 4.0  NEUTROABS 2.3  HGB 11.8*  HCT 36.8  MCV 87.8  PLT 171   Basic Metabolic Panel: Recent Labs  Lab 01/31/24 0746  NA  136  K 3.7  CL 100  CO2 26  GLUCOSE 100*  BUN 13  CREATININE 0.98  CALCIUM 9.2   Liver Function Tests: Recent Labs  Lab 01/31/24 0746  AST 21  ALT 16  ALKPHOS 91  BILITOT 0.3  PROT 7.3  ALBUMIN 3.7   CBG: Recent Labs  Lab 01/31/24 0740  GLUCAP 95    Discharge time spent: less than 30 minutes.  Signed: Jonah Blue, MD [facility] 02/02/2024

## 2024-02-02 NOTE — TOC Transition Note (Signed)
 Transition of Care [facility]) - Discharge Note   Patient Details  Name: Katrina Rose MRN: [id number] Date of Birth: [birth year]  [facility]) CM/SW Contact:  Cherre Blanc, RN Phone Number: 02/02/2024, 11:45 AM   Clinical Narrative:    Patient is medically clear for dc to the [facility]. Patient will be transported to the facility via [facility]. TOC spoke with the patient's sister and she is agreeable with the dc plan.    Final next level of care: Assisted Living Barriers to Discharge: Other (must enter comment) (ALF acceptance back)   Patient Goals and CMS Choice            Discharge Placement                       Discharge Plan and Services Additional resources added to the After Visit Summary for                                       Social Drivers of Health (SDOH) Interventions SDOH Screenings   Food Insecurity: No Food Insecurity (03/22/2023)  Housing: Patient Unable To Answer (01/31/2024)  Transportation Needs: Patient Unable To Answer (01/31/2024)  Utilities: Patient Declined (01/31/2024)  Social Connections: Unknown (01/31/2024)  Tobacco Use: High Risk (01/31/2024)     Readmission Risk Interventions     No data to display

## 2024-02-02 NOTE — [place] FL2 (Signed)
 [place] MEDICAID FL2 LEVEL OF CARE FORM     IDENTIFICATION  Patient Name: Katrina Rose Birthdate: 14-Nov-1960 Sex: female Admission Date (Current Location): 01/31/2024  [place] and [place] Number:  Chiropodist and Address:  [facility], [address] [place], [place] [address]      Provider Number: [id number]  Attending Physician Name and Address:  Jonah Blue, MD  Relative Name and Phone Number:  [name]    Current Level of Care: SNF Recommended Level of Care: Skilled Nursing Facility Prior Approval Number:    Date Approved/Denied:   PASRR Number:    Discharge Plan: SNF    Current Diagnoses: Patient Active Problem List   Diagnosis Date Noted   Seizure (HCC) 01/31/2024   Chronic pancreatitis (HCC) 03/23/2023   Metabolic acidosis 03/22/2023   Symptomatic anemia 03/21/2023   Anemia due to gastrointestinal blood loss, suspect chronic 03/21/2023   Dementia with behavioral disturbance (HCC) 03/21/2023   Hypertension 03/21/2023   Acute renal failure superimposed on stage 3a chronic kidney disease (HCC) 03/21/2023   Hypotension 03/21/2023   IDA (iron deficiency anemia) 03/21/2023   AICD (automatic cardioverter/defibrillator) present 03/21/2023   LBBB (left bundle branch block) 03/21/2023   History of Clostridioides difficile infection 2017 03/21/2023   Syncope 03/21/2023   DAT (dementia of Alzheimer type) (HCC) 07/30/2022   C. difficile diarrhea 07/20/2016   Chronic systolic CHF (congestive heart failure), NYHA class 3 (HCC) 09/07/2014   Cardiomyopathy (HCC) 04/06/2014    Orientation RESPIRATION BLADDER Height & Weight     Self, Time    Incontinent Weight: 46.5 kg Height:  5\' 2"  (157.5 cm)  BEHAVIORAL SYMPTOMS/MOOD NEUROLOGICAL BOWEL NUTRITION STATUS      Incontinent Diet (Regular)  AMBULATORY STATUS COMMUNICATION OF NEEDS Skin   Limited Assist Verbally                         Personal Care Assistance Level of  Assistance              Functional Limitations Info             SPECIAL CARE FACTORS FREQUENCY                       Contractures      Additional Factors Info  Code Status Code Status Info: DNR             Current Medications (02/02/2024):  This is the current hospital active medication list Current Facility-Administered Medications  Medication Dose Route Frequency Provider Last Rate Last Admin   enoxaparin (LOVENOX) injection 40 mg  40 mg Subcutaneous Q24H Floydene Flock, MD   40 mg at 02/01/24 2057   levETIRAcetam (KEPPRA) tablet 500 mg  500 mg Oral BID Jefferson Fuel, MD   500 mg at 02/02/24 0944   LORazepam (ATIVAN) injection 4 mg  4 mg Intravenous Q5 Min x 2 PRN Floydene Flock, MD       ondansetron [facility]) tablet 4 mg  4 mg Oral Q6H PRN Floydene Flock, MD       Or   ondansetron [facility]) injection 4 mg  4 mg Intravenous Q6H PRN Floydene Flock, MD       Oral care mouth rinse  15 mL Mouth Rinse PRN Floydene Flock, MD       pantoprazole (PROTONIX) EC tablet 40 mg  40 mg Oral Daily Floydene Flock, MD  40 mg at 02/02/24 0944     Discharge Medications: Please see discharge summary for a list of discharge medications.  Relevant Imaging Results:  Relevant Lab Results:   Additional Information [id number]  Cherre Blanc, RN

## 2024-02-05 NOTE — [place] FL2 (Signed)
 [place] MEDICAID FL2 LEVEL OF CARE FORM     IDENTIFICATION  Patient Name: Katrina Rose Birthdate: 1961-03-29 Sex: female Admission Date (Current Location): 01/31/2024  [place] and [place] Number:  Chiropodist and Address:  [facility], [address], [place], [place] [address]      Provider Number: [id number]  Attending Physician Name and Address:  No att. providers found  Relative Name and Phone Number:  [name]    Current Level of Care: SNF Recommended Level of Care: Skilled Nursing Facility Prior Approval Number:    Date Approved/Denied:   PASRR Number: [id number]  Discharge Plan: SNF    Current Diagnoses: Patient Active Problem List   Diagnosis Date Noted   Seizure (HCC) 01/31/2024   Chronic pancreatitis (HCC) 03/23/2023   Metabolic acidosis 03/22/2023   Symptomatic anemia 03/21/2023   Anemia due to gastrointestinal blood loss, suspect chronic 03/21/2023   Dementia with behavioral disturbance (HCC) 03/21/2023   Hypertension 03/21/2023   Acute renal failure superimposed on stage 3a chronic kidney disease (HCC) 03/21/2023   Hypotension 03/21/2023   IDA (iron deficiency anemia) 03/21/2023   AICD (automatic cardioverter/defibrillator) present 03/21/2023   LBBB (left bundle branch block) 03/21/2023   History of Clostridioides difficile infection 2017 03/21/2023   Syncope 03/21/2023   DAT (dementia of Alzheimer type) (HCC) 07/30/2022   C. difficile diarrhea 07/20/2016   Chronic systolic CHF (congestive heart failure), NYHA class 3 (HCC) 09/07/2014   Cardiomyopathy (HCC) 04/06/2014    Orientation RESPIRATION BLADDER Height & Weight     Self, Time    Incontinent Weight: 46.5 kg Height:  5\' 2"  (157.5 cm)  BEHAVIORAL SYMPTOMS/MOOD NEUROLOGICAL BOWEL NUTRITION STATUS      Incontinent Diet (Regular)  AMBULATORY STATUS COMMUNICATION OF NEEDS Skin   Limited Assist Verbally                         Personal Care  Assistance Level of Assistance              Functional Limitations Info             SPECIAL CARE FACTORS FREQUENCY                       Contractures      Additional Factors Info  Code Status Code Status Info: DNR             Current Medications (02/05/2024):  This is the current hospital active medication list No current facility-administered medications for this encounter.   Current Outpatient Medications  Medication Sig Dispense Refill   acetaminophen (PHARBETOL) 325 MG tablet Take 650 mg by mouth every 6 (six) hours as needed for headache.     atorvastatin (LIPITOR) 20 MG tablet Take 20 mg by mouth daily.     Cholecalciferol (VITAMIN D3) 25 MCG (1000 UT) CAPS Take by mouth daily.     ferrous sulfate (FEROSUL) 325 (65 FE) MG tablet Take 325 mg by mouth daily with breakfast.     furosemide (LASIX) 20 MG tablet Take 20 mg by mouth daily.     Lactobacillus 0.05-0.05 MG TABS      loperamide (ANTI-DIARRHEAL) 2 MG tablet Take 2 mg by mouth 4 (four) times daily as needed for diarrhea or loose stools.     magnesium hydroxide (MILK OF MAGNESIA) 400 MG/5ML suspension Take 30 mLs by mouth daily as needed for mild constipation.     Melatonin  5 MG CHEW      pantoprazole (PROTONIX) 40 MG tablet Take 40 mg by mouth daily.     potassium chloride (MICRO-K) 10 MEQ CR capsule Take 10 mEq by mouth daily.     QUEtiapine (SEROQUEL) 100 MG tablet Take 1 tablet (100 mg total) by mouth at bedtime.     rivastigmine (EXELON) 3 MG capsule Take 3 mg by mouth daily.     sodium bicarbonate 650 MG tablet Take 650 mg by mouth 2 (two) times daily.     ZENPEP 25000-79000 units CPEP Take 25,000 Units by mouth in the morning, at noon, and at bedtime.     hydrocortisone cream 1 % Apply 1 Application topically 2 (two) times daily. Apply Daily To Minor Skin/ Irritations Until Healed (Notify Physician If Symptoms Persist Greater Than 3 Days)     levETIRAcetam (KEPPRA) 500 MG tablet Take 1 tablet (500  mg total) by mouth 2 (two) times daily.       Discharge Medications: Please see discharge summary for a list of discharge medications.  Relevant Imaging Results:  Relevant Lab Results:   Additional Information [id number]  Cherre Blanc, RN

## 2024-02-16 ENCOUNTER — Emergency Department

## 2024-02-16 ENCOUNTER — Other Ambulatory Visit: Payer: Self-pay

## 2024-02-16 ENCOUNTER — Emergency Department
Admission: EM | Admit: 2024-02-16 | Discharge: 2024-02-17 | Disposition: A | Discharge status: Home / Self Care | Attending: Emergency Medicine | Admitting: Emergency Medicine

## 2024-02-16 DIAGNOSIS — Z79899 Other long term (current) drug therapy: Secondary | ICD-10-CM | POA: Diagnosis not present

## 2024-02-16 DIAGNOSIS — D72829 Elevated white blood cell count, unspecified: Secondary | ICD-10-CM | POA: Insufficient documentation

## 2024-02-16 DIAGNOSIS — I5022 Chronic systolic (congestive) heart failure: Secondary | ICD-10-CM | POA: Insufficient documentation

## 2024-02-16 DIAGNOSIS — F0390 Unspecified dementia without behavioral disturbance: Secondary | ICD-10-CM | POA: Diagnosis not present

## 2024-02-16 DIAGNOSIS — I110 Hypertensive heart disease with heart failure: Secondary | ICD-10-CM | POA: Diagnosis not present

## 2024-02-16 DIAGNOSIS — R112 Nausea with vomiting, unspecified: Secondary | ICD-10-CM | POA: Diagnosis present

## 2024-02-16 DIAGNOSIS — N390 Urinary tract infection, site not specified: Secondary | ICD-10-CM | POA: Insufficient documentation

## 2024-02-16 LAB — CBC WITH DIFFERENTIAL/PLATELET
Abs Immature Granulocytes: 0.05 10*3/uL (ref 0.00–0.07)
Basophils Absolute: 0.1 10*3/uL (ref 0.0–0.1)
Basophils Relative: 0 %
Eosinophils Absolute: 0 10*3/uL (ref 0.0–0.5)
Eosinophils Relative: 0 %
HCT: 35.9 % — ABNORMAL LOW (ref 36.0–46.0)
Hemoglobin: 11.7 g/dL — ABNORMAL LOW (ref 12.0–15.0)
Immature Granulocytes: 0 %
Lymphocytes Relative: 6 %
Lymphs Abs: 0.8 10*3/uL (ref 0.7–4.0)
MCH: 28.7 pg (ref 26.0–34.0)
MCHC: 32.6 g/dL (ref 30.0–36.0)
MCV: 88 fL (ref 80.0–100.0)
Monocytes Absolute: 0.4 10*3/uL (ref 0.1–1.0)
Monocytes Relative: 3 %
Neutro Abs: 12.6 10*3/uL — ABNORMAL HIGH (ref 1.7–7.7)
Neutrophils Relative %: 91 %
Platelets: 263 10*3/uL (ref 150–400)
RBC: 4.08 MIL/uL (ref 3.87–5.11)
RDW: 15.9 % — ABNORMAL HIGH (ref 11.5–15.5)
WBC: 13.9 10*3/uL — ABNORMAL HIGH (ref 4.0–10.5)
nRBC: 0 % (ref 0.0–0.2)

## 2024-02-16 LAB — URINALYSIS, ROUTINE W REFLEX MICROSCOPIC
Bilirubin Urine: NEGATIVE
Glucose, UA: NEGATIVE mg/dL
Hgb urine dipstick: NEGATIVE
Ketones, ur: NEGATIVE mg/dL
Nitrite: POSITIVE — AB
Protein, ur: 30 mg/dL — AB
Specific Gravity, Urine: 1.023 (ref 1.005–1.030)
pH: 6 (ref 5.0–8.0)

## 2024-02-16 LAB — COMPREHENSIVE METABOLIC PANEL WITH GFR
ALT: 22 U/L (ref 0–44)
AST: 23 U/L (ref 15–41)
Albumin: 4 g/dL (ref 3.5–5.0)
Alkaline Phosphatase: 87 U/L (ref 38–126)
Anion gap: 10 (ref 5–15)
BUN: 18 mg/dL (ref 8–23)
CO2: 29 mmol/L (ref 22–32)
Calcium: 9.6 mg/dL (ref 8.9–10.3)
Chloride: 100 mmol/L (ref 98–111)
Creatinine, Ser: 0.77 mg/dL (ref 0.44–1.00)
GFR, Estimated: >60 mL/min (ref >60)
Glucose, Bld: 126 mg/dL — ABNORMAL HIGH (ref 70–99)
Potassium: 4.1 mmol/L (ref 3.5–5.1)
Sodium: 139 mmol/L (ref 135–145)
Total Bilirubin: 0.6 mg/dL (ref 0.0–1.2)
Total Protein: 7.5 g/dL (ref 6.5–8.1)

## 2024-02-16 LAB — LIPASE, BLOOD: Lipase: 47 U/L (ref 11–51)

## 2024-02-16 MED ORDER — ONDANSETRON HCL 4 MG/2ML IJ SOLN
4.0000 mg | Freq: Once | INTRAMUSCULAR | Status: AC
  Administered 2024-02-16: 4 mg via INTRAVENOUS
  Filled 2024-02-16: qty 2

## 2024-02-16 MED ORDER — PANTOPRAZOLE SODIUM 40 MG IV SOLR
40.0000 mg | Freq: Once | INTRAVENOUS | Status: AC
  Administered 2024-02-16: 40 mg via INTRAVENOUS
  Filled 2024-02-16: qty 10

## 2024-02-16 MED ORDER — SODIUM CHLORIDE 0.9 % IV BOLUS
1000.0000 mL | Freq: Once | INTRAVENOUS | Status: AC
  Administered 2024-02-16: 1000 mL via INTRAVENOUS

## 2024-02-16 MED ORDER — IOHEXOL 300 MG/ML  SOLN
75.0000 mL | Freq: Once | INTRAMUSCULAR | Status: AC | PRN
  Administered 2024-02-16: 75 mL via INTRAVENOUS

## 2024-02-16 NOTE — ED Provider Notes (Signed)
 [facility] Provider Note    Event Date/Time   First MD Initiated Contact with Patient 02/16/24 2304     (approximate)   History   Nausea/vomiting/diarrhea   HPI  Level V caveat: Limited by dementia  Katrina Rose is a 63 y.o. female brought to the ED via EMS from [place] [facility] [place] with a chief complaint of nausea/vomiting/diarrhea.  Staff reports patient having emesis since 6:30 PM.  Facility reported coffee-ground emesis to EMS.  Rest of history unobtainable secondary to patient's dementia.     Past Medical History   Past Medical History:  Diagnosis Date   AICD (automatic cardioverter/defibrillator) present    CHF (congestive heart failure) (HCC)    Dementia (HCC)    GERD (gastroesophageal reflux disease)    Hypertension    IDA (iron deficiency anemia)    Insomnia    LBBB (left bundle branch block)      Active Problem List   Patient Active Problem List   Diagnosis Date Noted   Seizure (HCC) 01/31/2024   Chronic pancreatitis (HCC) 03/23/2023   Metabolic acidosis 03/22/2023   Symptomatic anemia 03/21/2023   Anemia due to gastrointestinal blood loss, suspect chronic 03/21/2023   Dementia with behavioral disturbance (HCC) 03/21/2023   Hypertension 03/21/2023   Acute renal failure superimposed on stage 3a chronic kidney disease (HCC) 03/21/2023   Hypotension 03/21/2023   IDA (iron deficiency anemia) 03/21/2023   AICD (automatic cardioverter/defibrillator) present 03/21/2023   LBBB (left bundle branch block) 03/21/2023   History of Clostridioides difficile infection 2017 03/21/2023   Syncope 03/21/2023   DAT (dementia of Alzheimer type) (HCC) 07/30/2022   C. difficile diarrhea 07/20/2016   Chronic systolic CHF (congestive heart failure), NYHA class 3 (HCC) 09/07/2014   Cardiomyopathy (HCC) 04/06/2014     Past Surgical History   Past Surgical History:  Procedure Laterality Date   ANKLE SURGERY Left    CARDIAC CATHETERIZATION      COLONOSCOPY     COLONOSCOPY WITH PROPOFOL N/A 10/07/2021   Procedure: COLONOSCOPY WITH PROPOFOL;  Surgeon: Jaynie Collins, DO;  Location: [facility] ENDOSCOPY;  Service: Gastroenterology;  Laterality: N/A;   debridement and closure sternal wound     DILATION AND CURETTAGE OF UTERUS     ESOPHAGOGASTRODUODENOSCOPY     ESOPHAGOGASTRODUODENOSCOPY (EGD) WITH PROPOFOL N/A 10/07/2021   Procedure: ESOPHAGOGASTRODUODENOSCOPY (EGD) WITH PROPOFOL;  Surgeon: Jaynie Collins, DO;  Location: [facility] ENDOSCOPY;  Service: Gastroenterology;  Laterality: N/A;   ESOPHAGOGASTRODUODENOSCOPY (EGD) WITH PROPOFOL N/A 03/22/2023   Procedure: ESOPHAGOGASTRODUODENOSCOPY (EGD) WITH PROPOFOL;  Surgeon: [name], MD;  Location: [facility] ENDOSCOPY;  Service: Gastroenterology;  Laterality: N/A;   ICD GENERATOR CHANGEOUT N/A 08/01/2021   Procedure: ICD GENERATOR CHANGEOUT;  Surgeon: Marcina Millard, MD;  Location: [facility] INVASIVE CV LAB;  Service: Cardiovascular;  Laterality: N/A;   pacermaker/ defibrilater   2010   TUBAL LIGATION       Home Medications   Prior to Admission medications   Medication Sig Start Date End Date Taking? Authorizing Provider  cephALEXin (KEFLEX) 500 MG capsule Take 1 capsule (500 mg total) by mouth 3 (three) times daily. 02/17/24  Yes Irean Hong, MD  ondansetron (ZOFRAN-ODT) 4 MG disintegrating tablet Take 1 tablet (4 mg total) by mouth every 8 (eight) hours as needed for nausea or vomiting. 02/17/24  Yes Irean Hong, MD  acetaminophen (PHARBETOL) 325 MG tablet Take 650 mg by mouth every 6 (six) hours as needed for headache.    [provider]  atorvastatin (LIPITOR) 20 MG tablet Take 20 mg by mouth daily.    [provider]  Cholecalciferol (VITAMIN D3) 25 MCG (1000 UT) CAPS Take by mouth daily.    [provider]  ferrous sulfate (FEROSUL) 325 (65 FE) MG tablet Take 325 mg by mouth daily with breakfast.    [provider]  furosemide (LASIX) 20 MG  tablet Take 20 mg by mouth daily.    [provider]  hydrocortisone cream 1 % Apply 1 Application topically 2 (two) times daily. Apply Daily To Minor Skin/ Irritations Until Healed (Notify Physician If Symptoms Persist Greater Than 3 Days)    [provider]  Lactobacillus 0.05-0.05 MG TABS     [provider]  levETIRAcetam (KEPPRA) 500 MG tablet Take 1 tablet (500 mg total) by mouth 2 (two) times daily. 02/02/24   Jonah Blue, MD  loperamide (ANTI-DIARRHEAL) 2 MG tablet Take 2 mg by mouth 4 (four) times daily as needed for diarrhea or loose stools.    [provider]  magnesium hydroxide (MILK OF MAGNESIA) 400 MG/5ML suspension Take 30 mLs by mouth daily as needed for mild constipation.    [provider]  Melatonin 5 MG CHEW     [provider]  pantoprazole (PROTONIX) 40 MG tablet Take 40 mg by mouth daily.    [provider]  potassium chloride (MICRO-K) 10 MEQ CR capsule Take 10 mEq by mouth daily. 06/17/23   [provider]  QUEtiapine (SEROQUEL) 100 MG tablet Take 1 tablet (100 mg total) by mouth at bedtime. 03/27/23   [name], MD  rivastigmine (EXELON) 3 MG capsule Take 3 mg by mouth daily.    [provider]  sodium bicarbonate 650 MG tablet Take 650 mg by mouth 2 (two) times daily.    [provider]  ZENPEP 25000-79000 units CPEP Take 25,000 Units by mouth in the morning, at noon, and at bedtime.    [provider]     Allergies  Sulfa antibiotics   Family History   Family History  Problem Relation Age of Onset   CAD Mother    Colon cancer Father      Physical Exam  Triage Vital Signs: ED Triage Vitals  Encounter Vitals Group     BP      Systolic BP Percentile      Diastolic BP Percentile      Pulse      Resp      Temp      Temp src      SpO2      Weight      Height      Head Circumference      Peak Flow      Pain Score      Pain Loc      Pain Education       Exclude from Growth Chart     Updated Vital Signs: BP 102/68   Pulse 93   Temp 97.8 F (36.6 C) (Axillary)   Resp 13   Ht 5\' 2"  (1.575 m)   Wt 45.3 kg   SpO2 100%   BMI 18.27 kg/m    General: Awake, no distress.  CV:  RRR.  Good peripheral perfusion.  Resp:  Normal effort.  CTAB. Abd:  Nontender.  No distention.  Other:  No truncal vesicles.   ED Results / Procedures / Treatments  Labs (all labs ordered are listed, but only abnormal results  are displayed) Labs Reviewed  CBC WITH DIFFERENTIAL/PLATELET - Abnormal; Notable for the following components:      Result Value   WBC 13.9 (*)    Hemoglobin 11.7 (*)    HCT 35.9 (*)    RDW 15.9 (*)    Neutro Abs 12.6 (*)    All other components within normal limits  COMPREHENSIVE METABOLIC PANEL WITH GFR - Abnormal; Notable for the following components:   Glucose, Bld 126 (*)    All other components within normal limits  URINALYSIS, ROUTINE W REFLEX MICROSCOPIC - Abnormal; Notable for the following components:   Color, Urine YELLOW (*)    APPearance HAZY (*)    Protein, ur 30 (*)    Nitrite POSITIVE (*)    Leukocytes,Ua TRACE (*)    Bacteria, UA MANY (*)    All other components within normal limits  CULTURE, BLOOD (ROUTINE X 2)  CULTURE, BLOOD (ROUTINE X 2)  URINE CULTURE  LIPASE, BLOOD  LACTIC ACID, PLASMA  LACTIC ACID, PLASMA     EKG  ED ECG REPORT I, Maleta Pacha J, the attending physician, personally viewed and interpreted this ECG.   Date: 02/16/2024  EKG Time: 2321  Rate: 84  Rhythm: normal sinus rhythm  Axis: Normal  Intervals:none  ST&T Change: Nonspecific    RADIOLOGY I have independently visualized and interpreted patient's imaging study as well as noted the radiology interpretation:  Chest x-ray: No acute cardiopulmonary process  CT abdomen/pelvis: Fluid distention of stomach/proximal duodenum which may be secondary to mild nutcracker phenomenon  Official radiology report(s): CT ABDOMEN  PELVIS W CONTRAST Result Date: 02/17/2024 CLINICAL DATA:  Acute abdominal pain and hematemesis EXAM: CT ABDOMEN AND PELVIS WITH CONTRAST TECHNIQUE: Multidetector CT imaging of the abdomen and pelvis was performed using the standard protocol following bolus administration of intravenous contrast. RADIATION DOSE REDUCTION: This exam was performed according to the departmental dose-optimization program which includes automated exposure control, adjustment of the mA and/or kV according to patient size and/or use of iterative reconstruction technique. CONTRAST:  75mL OMNIPAQUE IOHEXOL 300 MG/ML  SOLN COMPARISON:  03/21/2023 FINDINGS: Lower chest: No acute abnormality. Hepatobiliary: No focal liver abnormality is seen. No gallstones, gallbladder wall thickening, or biliary dilatation. Pancreas: Unremarkable. No pancreatic ductal dilatation or surrounding inflammatory changes. Spleen: Normal in size without focal abnormality. Adrenals/Urinary Tract: Adrenal glands are within normal limits. Kidneys demonstrate a normal enhancement pattern bilaterally. Delayed images demonstrate normal excretion. No obstructive changes are seen. The bladder is partially distended. Stomach/Bowel: No obstructive or inflammatory changes of the colon are seen. The appendix is within normal limits. No inflammatory changes are noted. Stomach is well distended with ingested fluid. Mild distention of duodenum is noted without obstructing lesion. This extends to the level of the aorta and may have a mild degree of nutcracker phenomena present. The more distal small bowel is unremarkable. Vascular/Lymphatic: Aortic atherosclerosis. No enlarged abdominal or pelvic lymph nodes. Reproductive: Uterus and bilateral adnexa are unremarkable. Other: No abdominal wall hernia or abnormality. No abdominopelvic ascites. Musculoskeletal: No acute or significant osseous findings. IMPRESSION: Fluid distension of the stomach and proximal duodenum which may be in  part due to a mild nutcracker phenomena. The more distal small bowel is within normal limits. No other focal abnormality is noted. Electronically Signed   By: Violeta Grey M.D.   On: 02/17/2024 00:03   DG Chest Port 1 View Result Date: 02/17/2024 CLINICAL DATA:  Nausea and vomiting EXAM: PORTABLE CHEST 1 VIEW COMPARISON:  01/31/2024 FINDINGS: Cardiac  shadow is within normal limits. Lungs are well aerated bilaterally. Defibrillator is again seen. No acute bony abnormality is noted. IMPRESSION: No acute abnormality noted. Electronically Signed   By: Alcide Clever M.D.   On: 02/17/2024 00:00     PROCEDURES:  Critical Care performed: No  .1-3 Lead EKG Interpretation  Performed by: Irean Hong, MD Authorized by: Irean Hong, MD     Interpretation: normal     ECG rate:  95   ECG rate assessment: normal     Rhythm: sinus rhythm     Ectopy: none     Conduction: normal   Comments:     Patient placed on cardiac monitor to evaluate for arrhythmias  Rectal exam: External exam unremarkable.  Tan stool obtained on gloved finger which is guaiac negative.   MEDICATIONS ORDERED IN ED: Medications  sodium chloride 0.9 % bolus 1,000 mL (0 mLs Intravenous Stopped 02/17/24 0121)  ondansetron (ZOFRAN) injection 4 mg (4 mg Intravenous Given 02/16/24 2337)  pantoprazole (PROTONIX) injection 40 mg (40 mg Intravenous Given 02/16/24 2341)  iohexol (OMNIPAQUE) 300 MG/ML solution 75 mL (75 mLs Intravenous Contrast Given 02/16/24 2349)  cefTRIAXone (ROCEPHIN) 1 g in sodium chloride 0.9 % 100 mL IVPB (0 g Intravenous Stopped 02/17/24 0101)  levETIRAcetam (KEPPRA) IVPB 500 mg/100 mL premix (0 mg Intravenous Stopped 02/17/24 0143)  levETIRAcetam (KEPPRA) IVPB 500 mg/100 mL premix (0 mg Intravenous Stopped 02/17/24 0208)     IMPRESSION / MDM / ASSESSMENT AND PLAN / ED COURSE  I reviewed the triage vital signs and the nursing notes.                             63 year old female presenting with  nausea/vomiting/diarrhea; facility reports hematemesis. Differential diagnosis includes, but is not limited to, biliary disease (biliary colic, acute cholecystitis, cholangitis, choledocholithiasis, etc), intrathoracic causes for epigastric abdominal pain including ACS, gastritis, duodenitis, pancreatitis, small bowel or large bowel obstruction, abdominal aortic aneurysm, hernia, and ulcer(s).  I personally reviewed patient's records and note a recent hospitalization 01/31/2024 for altered mental status.  Patient's presentation is most consistent with acute complicated illness / injury requiring diagnostic workup.  The patient is on the cardiac monitor to evaluate for evidence of arrhythmia and/or significant heart rate changes.  Will obtain lab work, UA, CT abdomen/pelvis.  Initiate IV fluid hydration, Zofran, Protonix.  Will reassess.  Clinical Course as of 02/17/24 7628  Wed Feb 17, 2024  0011 Laboratory results notable for mild leukocytosis WBC 13.9, unremarkable electrolytes including LFTs/lipase; nitrite and leukocyte positive UTI.  CT unremarkable other than mention of possible mild nutcracker phenomenon.  Will check blood cultures/urine culture/lactic acid.  Administer IV Rocephin.  Patient resting in no acute distress. [JS]  0123 Patient's POA and older sister called for an update.  I updated her.  Lactic acid came back unremarkable.  Will give her nighttime Keppra dose via IV since patient vomited her nighttime meds at the facility.  Patient has had no vomiting or stool while in the ED.  Anticipate discharge back to facility upon completion of IV Rocephin and Keppra.  POA agreeable with plan of care. [JS]  0218 IV Keppra completed.  Patient has not had vomiting or stool since being in the emergency department.  Nurse will update patient's sister/POA.  Patient is stable for discharge back to her facility with prescription for antibiotics.  Strict return precautions given.  Sister verbalizes  understanding and agrees  with plan of care. [JS]    Clinical Course User Index [JS] Norlene Beavers, MD     FINAL CLINICAL IMPRESSION(S) / ED DIAGNOSES   Final diagnoses:  Nausea and vomiting, unspecified vomiting type  Lower urinary tract infectious disease     Rx / DC Orders   ED Discharge Orders          Ordered    cephALEXin (KEFLEX) 500 MG capsule  3 times daily        02/17/24 0218    ondansetron (ZOFRAN-ODT) 4 MG disintegrating tablet  Every 8 hours PRN        02/17/24 0218             Note:  This document was prepared using Dragon voice recognition software and may include unintentional dictation errors.   Nolin Grell J, MD 02/17/24 [phone]

## 2024-02-16 NOTE — ED Triage Notes (Signed)
 Pt to ED via [facility] from [place] [facility] [place] c/o vomiting. Pt has been having nausea and vomiting since 6:30pm. Facility reports coffee ground emesis. Does not reports any abd pain. Per EMS by is Alert but Oriented x0,  which is baseline.

## 2024-02-16 NOTE — ED Notes (Signed)
 Pt to radiology at this time.

## 2024-02-17 DIAGNOSIS — R112 Nausea with vomiting, unspecified: Secondary | ICD-10-CM | POA: Diagnosis not present

## 2024-02-17 LAB — LACTIC ACID, PLASMA: Lactic Acid, Venous: 1.2 mmol/L (ref 0.5–1.9)

## 2024-02-17 MED ORDER — SODIUM CHLORIDE 0.9 % IV SOLN
1.0000 g | Freq: Once | INTRAVENOUS | Status: AC
  Administered 2024-02-17: 1 g via INTRAVENOUS
  Filled 2024-02-17: qty 10

## 2024-02-17 MED ORDER — CEPHALEXIN 500 MG PO CAPS: 500.0000 mg | ORAL_CAPSULE | Freq: Three times a day (TID) | ORAL | 0 refills | Status: DC

## 2024-02-17 MED ORDER — LEVETIRACETAM IN NACL 500 MG/100ML IV SOLN
500.0000 mg | Freq: Once | INTRAVENOUS | Status: AC
  Administered 2024-02-17: 500 mg via INTRAVENOUS
  Filled 2024-02-17: qty 100

## 2024-02-17 MED ORDER — ONDANSETRON 4 MG PO TBDP: 4.0000 mg | ORAL_TABLET | Freq: Three times a day (TID) | ORAL | 0 refills | Status: DC | PRN

## 2024-02-17 MED ORDER — LEVETIRACETAM IN NACL 500 MG/100ML IV SOLN
500.0000 mg | Freq: Once | INTRAVENOUS | Status: AC
  Administered 2024-02-17: 500 mg via INTRAVENOUS
  Filled 2024-02-17 (×2): qty 100

## 2024-02-17 NOTE — ED Notes (Signed)
 Vallery Gavel, MD updated pt's sister/POA, Selena, via phone.

## 2024-02-17 NOTE — ED Notes (Signed)
 EMS here to transfer pt back to [place] [facility] [place]. Report and paperwork given to EMS. Report attempted to the [facility] multiple times but no answer. Pt taken out of department by EMS in stretcher. No acute distress noted.

## 2024-02-17 NOTE — ED Notes (Addendum)
 Went to stop IV keppra and discovered IV tubing not attached to pt, therefore, dose was not received. Pharmacy called and new bag requested at this time. Vallery Gavel, MD notified and med reordered.

## 2024-02-17 NOTE — ED Notes (Signed)
 [facility] called for transport to [facility].

## 2024-02-17 NOTE — Discharge Instructions (Signed)
 Take and finish antibiotic as prescribed.  You may take Zofran as needed for nausea/vomiting.  Clear liquids x 12 hours, then bland diet x 3 days, then slowly advance diet as tolerated.  Return to the ER for recurrent or worsening symptoms, persistent vomiting, difficulty breathing or other concerns.

## 2024-02-17 NOTE — ED Notes (Signed)
 2 unsuccessful attempts for IV in left hand and left FA.

## 2024-02-17 NOTE — ED Notes (Signed)
 Pt's sister/POA, Selena, called and updated at this time. She is aware of pt returning to [place] [facility] [place] via EMS.

## 2024-02-17 NOTE — ED Notes (Signed)
 Attempted to call report to the [place] [facility] [place]. No answer at this time.

## 2024-02-17 NOTE — ED Notes (Signed)
 Pt's sister, Vania Genin, called this RN while OTF; I told her I would call her back upon my return to verify her information, she verbalized understanding. Attempted calling number provided in chart, no answer.

## 2024-02-17 NOTE — ED Notes (Signed)
 Attempted to call report to the [facility] [place], no answer at this time.

## 2024-02-19 LAB — URINE CULTURE: Culture: >=100000 — AB

## 2024-02-22 LAB — CULTURE, BLOOD (ROUTINE X 2): Culture: NO GROWTH

## 2024-03-09 ENCOUNTER — Other Ambulatory Visit: Payer: Self-pay

## 2024-03-09 ENCOUNTER — Emergency Department
Admission: EM | Admit: 2024-03-09 | Discharge: 2024-03-10 | Disposition: A | Discharge status: Home / Self Care | Attending: Emergency Medicine | Admitting: Emergency Medicine

## 2024-03-09 DIAGNOSIS — I509 Heart failure, unspecified: Secondary | ICD-10-CM | POA: Insufficient documentation

## 2024-03-09 DIAGNOSIS — F0390 Unspecified dementia without behavioral disturbance: Secondary | ICD-10-CM | POA: Insufficient documentation

## 2024-03-09 DIAGNOSIS — N390 Urinary tract infection, site not specified: Secondary | ICD-10-CM | POA: Diagnosis not present

## 2024-03-09 DIAGNOSIS — R569 Unspecified convulsions: Secondary | ICD-10-CM | POA: Insufficient documentation

## 2024-03-09 LAB — BASIC METABOLIC PANEL WITH GFR
Anion gap: 10 (ref 5–15)
BUN: 18 mg/dL (ref 8–23)
CO2: 24 mmol/L (ref 22–32)
Calcium: 9.4 mg/dL (ref 8.9–10.3)
Chloride: 106 mmol/L (ref 98–111)
Creatinine, Ser: 0.78 mg/dL (ref 0.44–1.00)
GFR, Estimated: >60 mL/min (ref >60)
Glucose, Bld: 104 mg/dL — ABNORMAL HIGH (ref 70–99)
Potassium: 3.6 mmol/L (ref 3.5–5.1)
Sodium: 140 mmol/L (ref 135–145)

## 2024-03-09 LAB — CBC WITH DIFFERENTIAL/PLATELET
Abs Immature Granulocytes: 0.02 10*3/uL (ref 0.00–0.07)
Basophils Absolute: 0.1 10*3/uL (ref 0.0–0.1)
Basophils Relative: 1 %
Eosinophils Absolute: 0.1 10*3/uL (ref 0.0–0.5)
Eosinophils Relative: 2 %
HCT: 33.5 % — ABNORMAL LOW (ref 36.0–46.0)
Hemoglobin: 10.8 g/dL — ABNORMAL LOW (ref 12.0–15.0)
Immature Granulocytes: 0 %
Lymphocytes Relative: 28 %
Lymphs Abs: 2 10*3/uL (ref 0.7–4.0)
MCH: 29 pg (ref 26.0–34.0)
MCHC: 32.2 g/dL (ref 30.0–36.0)
MCV: 89.8 fL (ref 80.0–100.0)
Monocytes Absolute: 0.5 10*3/uL (ref 0.1–1.0)
Monocytes Relative: 7 %
Neutro Abs: 4.6 10*3/uL (ref 1.7–7.7)
Neutrophils Relative %: 62 %
Platelets: 318 10*3/uL (ref 150–400)
RBC: 3.73 MIL/uL — ABNORMAL LOW (ref 3.87–5.11)
RDW: 15.1 % (ref 11.5–15.5)
WBC: 7.3 10*3/uL (ref 4.0–10.5)
nRBC: 0 % (ref 0.0–0.2)

## 2024-03-09 NOTE — ED Provider Notes (Signed)
 [facility] Provider Note    Event Date/Time   First MD Initiated Contact with Patient 03/09/24 2347     (approximate)   History   Seizures   HPI  Katrina Rose is a 63 y.o. female history of dementia, CHF, seizures, here for seizure.  Per EMS patient is from [facility] [place], staff noted that when she was in the shower, her left hand was balled up.she was then noted to have a seizure, staff did say that she was taking her Keppra .  No facial droop or focal weakness noted.  Daughter states that patient's left hand is slightly contracted and stiff at baseline, states that she has not at her mental baseline right now.  Patient has no complaints.  Independent history obtained from EMS and daughter as above.   On independent review, she was admitted in April for seizure, was found to be lethargic with altered mental status and had a general tonic-clonic seizure in the emergency department, was loaded with Keppra  and given Ativan .  She was evaluated by neurology and continued on Keppra  500 mg twice daily.  Physical Exam   Triage Vital Signs: ED Triage Vitals  Encounter Vitals Group     BP 03/09/24 1953 101/85     Systolic BP Percentile --      Diastolic BP Percentile --      Pulse Rate 03/09/24 1953 (!) 124     Resp 03/09/24 1953 16     Temp 03/09/24 1953 98.2 F (36.8 C)     Temp Source 03/09/24 1953 Oral     SpO2 03/09/24 2007 99 %     Weight 03/09/24 2004 99 lb 3.3 oz (45 kg)     Height 03/09/24 2004 5\' 2"  (1.575 m)     Head Circumference --      Peak Flow --      Pain Score 03/09/24 2004 0     Pain Loc --      Pain Education --      Exclude from Growth Chart --     Most recent vital signs: Vitals:   03/10/24 0058 03/10/24 0100  BP: 95/70 105/65  Pulse: 85 88  Resp: 12 17  Temp: 98 F (36.7 C)   SpO2: 100% 98%     General: Awake, no distress.  CV:  Good peripheral perfusion.  Resp:  Normal effort.  No respiratory distress or  tachypnea Abd:  No distention.  Soft nontender Other:  Extraocular movements are intact, pupils are equal and reactive, her grip strength appears symmetrical, sensation is intact, able to dorsi and plantarflex equally   ED Results / Procedures / Treatments   Labs (all labs ordered are listed, but only abnormal results are displayed) Labs Reviewed  BASIC METABOLIC PANEL WITH GFR - Abnormal; Notable for the following components:      Result Value   Glucose, Bld 104 (*)    All other components within normal limits  CBC WITH DIFFERENTIAL/PLATELET - Abnormal; Notable for the following components:   RBC 3.73 (*)    Hemoglobin 10.8 (*)    HCT 33.5 (*)    All other components within normal limits  URINALYSIS, W/ REFLEX TO CULTURE (INFECTION SUSPECTED) - Abnormal; Notable for the following components:   Color, Urine YELLOW (*)    APPearance HAZY (*)    Leukocytes,Ua MODERATE (*)    Bacteria, UA FEW (*)    All other components within normal limits  URINE CULTURE  EKG  EKG shows, V paced rhythm, rate 103, prolonged QTc, T wave flattening in 1, aVL, V2, does not meet Sgarbossa's criteria, not significantly change compared to prior   RADIOLOGY On my independent interpretation, CT head without obvious intracranial hemorrhage   PROCEDURES:  Critical Care performed: No  Procedures   MEDICATIONS ORDERED IN ED: Medications  cefTRIAXone  (ROCEPHIN ) 1 g in sodium chloride  0.9 % 100 mL IVPB (has no administration in time range)     IMPRESSION / MDM / ASSESSMENT AND PLAN / ED COURSE  I reviewed the triage vital signs and the nursing notes.                              Differential diagnosis includes, but is not limited to, breakthrough seizure, electrolyte derangements, UTI.  Labs and EKG were obtained at triage, will add on UA, CT head, chest x-ray.  Will monitor her in the emergency department for additional seizure activity.  Patient's presentation is most consistent with  acute presentation with potential threat to life or bodily function.  Independent interpretation of labs and imaging below.  Patient was found to have a UTI, suspect this might have lower her seizure threshold, will give her a dose of ceftriaxone  here and discharge her with cefdinir.  She was monitored in the emergency department without recurrent seizures.  She appears to be at her mental baseline.  Otherwise considered but no indication for inpatient admission at this time, she is safe for outpatient management.  Will discharge with strict return precautions.  Instructed to follow-up with her primary care doctor and her neurologist for further management of her symptoms.  The patient is on the cardiac monitor to evaluate for evidence of arrhythmia and/or significant heart rate changes.   Clinical Course as of 03/10/24 0130  Thu Mar 10, 2024  0029 CT Head Wo Contrast Chronic atrophic and ischemic changes are noted.  [TT]  0056 DG Chest 1 View No active disease.  [TT]  0056 Independent review of labs, no leukocytosis, electrolytes fairly deranged, creatinine is normal. [TT]  0123 Urinalysis, w/ Reflex to Culture (Infection Suspected) -Urine, Clean Catch(!) UA is consistent with UTI. [TT]  0125 On independent chart review, prior urine cultures grew E. coli that is pan susceptible, will give her a dose of ceftriaxone  here. [TT]    Clinical Course User Index [TT] Shane Darling, MD     FINAL CLINICAL IMPRESSION(S) / ED DIAGNOSES   Final diagnoses:  Seizure (HCC)  Urinary tract infection without hematuria, site unspecified     Rx / DC Orders   ED Discharge Orders          Ordered    cefdinir (OMNICEF) 300 MG capsule  2 times daily        03/10/24 0130             Note:  This document was prepared using Dragon voice recognition software and may include unintentional dictation errors.    Shane Darling, MD 03/10/24 [phone]

## 2024-03-09 NOTE — ED Triage Notes (Signed)
 EMS brings pt in from [facility]; staff reports pt with left hand "balling up" while in shower;  currently takes keppra  for seizures; hx dementia

## 2024-03-09 NOTE — ED Triage Notes (Addendum)
 Patient brought in by EMS from the [facility]  because she was in the shower and staff noticed patient balled up her left hand and seemed like she might be having a seizure or possible stroke. Per daughter, patient's left hand is slightly contracted and stiff at baseline. Patient is at her baseline mentation-wise per daughter (Patient has history of dementia) and daughter states that her speech and appearance are normal. Patient did recently begin to have seizures and staff at her facility state that she did get her Keppra . No facial droop noted, no one-sided weakness noted.

## 2024-03-10 ENCOUNTER — Emergency Department

## 2024-03-10 ENCOUNTER — Other Ambulatory Visit

## 2024-03-10 DIAGNOSIS — R569 Unspecified convulsions: Secondary | ICD-10-CM | POA: Diagnosis not present

## 2024-03-10 LAB — URINALYSIS, W/ REFLEX TO CULTURE (INFECTION SUSPECTED)
Bilirubin Urine: NEGATIVE
Glucose, UA: NEGATIVE mg/dL
Hgb urine dipstick: NEGATIVE
Ketones, ur: NEGATIVE mg/dL
Nitrite: NEGATIVE
Protein, ur: NEGATIVE mg/dL
Specific Gravity, Urine: 1.026 (ref 1.005–1.030)
pH: 5 (ref 5.0–8.0)

## 2024-03-10 LAB — CBG MONITORING, ED: Glucose-Capillary: 90 mg/dL (ref 70–99)

## 2024-03-10 MED ORDER — SODIUM CHLORIDE 0.9 % IV SOLN
1.0000 g | Freq: Once | INTRAVENOUS | Status: AC
  Administered 2024-03-10: 1 g via INTRAVENOUS
  Filled 2024-03-10: qty 10

## 2024-03-10 MED ORDER — CEFDINIR 300 MG PO CAPS: 300.0000 mg | ORAL_CAPSULE | Freq: Two times a day (BID) | ORAL | 0 refills | Status: AC

## 2024-03-10 NOTE — ED Notes (Signed)
 Attempted to call [facility] with no answer.

## 2024-03-10 NOTE — Discharge Instructions (Signed)
Please take your antibiotics as prescribed for your urinary tract infection.

## 2024-03-10 NOTE — ED Notes (Signed)
 RN attempted to contact sister with update on pt status, unable to reach.

## 2024-03-10 NOTE — ED Notes (Signed)
 Report called to Drexel Gentles at [facility] [address]

## 2024-03-10 NOTE — ED Notes (Signed)
 Sister [facility]) called for update. Unable to reach sister at number provided.

## 2024-03-10 NOTE — ED Notes (Signed)
 Called [facility] to transport patient back to the   [facility] spoke with [employer]

## 2024-03-10 NOTE — ED Notes (Signed)
 Selena Crace POA called with update on pt condition. All questions answered.

## 2024-03-10 NOTE — ED Notes (Signed)
 Drexel Gentles called and made aware pt being transported back [facility]. [facility] given report. All questions answered.

## 2024-03-10 NOTE — ED Notes (Signed)
 RN attempted to call sister and update on diagnosis with no answer.

## 2024-03-12 LAB — URINE CULTURE: Culture: >=100000 — AB

## 2024-03-22 ENCOUNTER — Encounter (INDEPENDENT_AMBULATORY_CARE_PROVIDER_SITE_OTHER): Payer: Self-pay

## 2024-09-03 DEATH — deceased

## 2024-09-27 ENCOUNTER — Ambulatory Visit (INDEPENDENT_AMBULATORY_CARE_PROVIDER_SITE_OTHER): Payer: Medicare Other | Admitting: Nurse Practitioner
